# Patient Record
Sex: Female | Born: 1970 | Race: White | Hispanic: No | Marital: Married | Smoking: Former smoker
Health system: Southern US, Community
[De-identification: ages and names within clinical notes are randomized; demographics above are authoritative.]

## PROBLEM LIST (undated history)

## (undated) DIAGNOSIS — R109 Unspecified abdominal pain: Secondary | ICD-10-CM

## (undated) DIAGNOSIS — E785 Hyperlipidemia, unspecified: Secondary | ICD-10-CM

## (undated) DIAGNOSIS — R110 Nausea: Secondary | ICD-10-CM

## (undated) DIAGNOSIS — Z8742 Personal history of other diseases of the female genital tract: Secondary | ICD-10-CM

## (undated) DIAGNOSIS — N329 Bladder disorder, unspecified: Secondary | ICD-10-CM

## (undated) DIAGNOSIS — R011 Cardiac murmur, unspecified: Secondary | ICD-10-CM

## (undated) DIAGNOSIS — F17200 Nicotine dependence, unspecified, uncomplicated: Secondary | ICD-10-CM

## (undated) DIAGNOSIS — Z8249 Family history of ischemic heart disease and other diseases of the circulatory system: Secondary | ICD-10-CM

## (undated) DIAGNOSIS — F419 Anxiety disorder, unspecified: Secondary | ICD-10-CM

## (undated) DIAGNOSIS — F319 Bipolar disorder, unspecified: Secondary | ICD-10-CM

## (undated) DIAGNOSIS — Z9889 Other specified postprocedural states: Secondary | ICD-10-CM

## (undated) HISTORY — DX: Nausea: R11.0

## (undated) HISTORY — PX: LEEP: SHX91

## (undated) HISTORY — DX: Family history of ischemic heart disease and other diseases of the circulatory system: Z82.49

## (undated) HISTORY — DX: Personal history of other diseases of the female genital tract: Z87.42

## (undated) HISTORY — DX: Other specified postprocedural states: Z87.42

## (undated) HISTORY — PX: ELBOW SURGERY: SHX618

## (undated) HISTORY — DX: Nicotine dependence, unspecified, uncomplicated: F17.200

## (undated) HISTORY — PX: LAPAROSCOPIC OVARIAN CYSTECTOMY: SUR786

## (undated) HISTORY — DX: Bipolar disorder, unspecified: F31.9

## (undated) HISTORY — DX: Unspecified abdominal pain: R10.9

## (undated) HISTORY — PX: TONSILLECTOMY: SUR1361

## (undated) HISTORY — PX: RHINOPLASTY: SUR1284

## (undated) HISTORY — DX: Hyperlipidemia, unspecified: E78.5

## (undated) HISTORY — DX: Other specified postprocedural states: Z98.890

---

## 2009-04-02 ENCOUNTER — Encounter: Admission: RE | Admit: 2009-04-02 | Discharge: 2009-04-02 | Payer: Self-pay | Admitting: Orthopaedic Surgery

## 2010-07-15 ENCOUNTER — Ambulatory Visit: Payer: Self-pay | Admitting: Family Medicine

## 2010-11-02 ENCOUNTER — Ambulatory Visit: Payer: Self-pay | Admitting: Family Medicine

## 2011-02-19 ENCOUNTER — Encounter (INDEPENDENT_AMBULATORY_CARE_PROVIDER_SITE_OTHER): Payer: 59

## 2011-02-19 DIAGNOSIS — Z79899 Other long term (current) drug therapy: Secondary | ICD-10-CM

## 2011-03-16 ENCOUNTER — Encounter (INDEPENDENT_AMBULATORY_CARE_PROVIDER_SITE_OTHER): Payer: 59 | Admitting: Family Medicine

## 2011-03-16 ENCOUNTER — Other Ambulatory Visit: Payer: Self-pay | Admitting: Family Medicine

## 2011-03-16 ENCOUNTER — Other Ambulatory Visit (HOSPITAL_COMMUNITY)
Admission: RE | Admit: 2011-03-16 | Discharge: 2011-03-16 | Disposition: A | Discharge status: Home / Self Care | Payer: 59 | Source: Ambulatory Visit | Attending: Family Medicine | Admitting: Family Medicine

## 2011-03-16 DIAGNOSIS — Z8249 Family history of ischemic heart disease and other diseases of the circulatory system: Secondary | ICD-10-CM

## 2011-03-16 DIAGNOSIS — Z0000 Encounter for general adult medical examination without abnormal findings: Secondary | ICD-10-CM

## 2011-03-16 DIAGNOSIS — F319 Bipolar disorder, unspecified: Secondary | ICD-10-CM

## 2011-03-16 DIAGNOSIS — Z124 Encounter for screening for malignant neoplasm of cervix: Secondary | ICD-10-CM | POA: Insufficient documentation

## 2011-04-19 ENCOUNTER — Telehealth: Payer: Self-pay | Admitting: Family Medicine

## 2011-04-19 NOTE — Telephone Encounter (Signed)
I faxed the authorization per Dr.Lalonde

## 2011-04-19 NOTE — Telephone Encounter (Signed)
Renew diazepam

## 2011-05-27 ENCOUNTER — Ambulatory Visit (INDEPENDENT_AMBULATORY_CARE_PROVIDER_SITE_OTHER): Payer: 59 | Admitting: Family Medicine

## 2011-05-27 ENCOUNTER — Encounter: Payer: Self-pay | Admitting: Family Medicine

## 2011-05-27 VITALS — BP 112/68 | HR 76 | Ht 64.0 in | Wt 173.0 lb

## 2011-05-27 DIAGNOSIS — N939 Abnormal uterine and vaginal bleeding, unspecified: Secondary | ICD-10-CM

## 2011-05-27 DIAGNOSIS — N898 Other specified noninflammatory disorders of vagina: Secondary | ICD-10-CM

## 2011-05-27 NOTE — Progress Notes (Signed)
Patient hasn't had a period in 6 years due to having Mirena IUD.  Started having some spotting and clotting over the last month.  Denies any vaginal discharge.  Turned into a full-blown period this week, requiring the use of a tampon.  She put one in, and forgot about it.  Thinks she has had a tampon in since yesterday morning.  Has some lower abdominal tenderness (more related to having a period), has a headache (but awake since 4 am).  No foul smelling vaginal discharge.  Believes it is possible that the tampon may have fallen out into the toilet, but she wanted to have it checked to be sure.  She also is requesting to have the IUD strings checked.  Past Medical History  Diagnosis Date  . Bipolar disorder   . Tobacco use disorder    Current Outpatient Prescriptions on File Prior to Visit  Medication Sig Dispense Refill  . levonorgestrel (MIRENA) 20 MCG/24HR IUD 1 each by Intrauterine route once.         Allergies  Allergen Reactions  . Nitrous Oxide Other (See Comments)    Passes out   ROS: Denies fevers, vaginal discharge, odor or itch.  +irregular vaginal bleeding with Mirena.  No GI complaints, URI complaints, or other concerns  PHYSICAL EXAM: BP 112/68  Pulse 76  Ht 5\' 4"  (1.626 m)  Wt 173 lb (78.472 kg)  BMI 29.70 kg/m2 Well developed, pleasant female in no distress Abdomen: soft, minimal suprapubic tenderness. No rebound tenderness, guarding or masses External genitalia: normal without lesions Bimanual exam--no cervical motion tenderness.  Uterus and adnexa normal without masses or significant tenderness.  No tampon was able to be appreciated on bimanual exam.  Speculum exam was then performed to also look for retained tampon.  None was found.  IUD strings visualized at cervical os  ASSESSMENT/PLAN: 1. Vaginal bleeding    Suspected retained tampon, but none was present.  Patient reassured   F/u if develops foul smelling vaginal discharge or other concerns

## 2011-08-16 ENCOUNTER — Telehealth: Payer: Self-pay | Admitting: Family Medicine

## 2011-08-16 MED ORDER — DIAZEPAM 2 MG PO TABS: 2.0000 mg | ORAL_TABLET | ORAL | Status: DC | PRN

## 2011-08-16 NOTE — Telephone Encounter (Signed)
Diazepam renewed.

## 2011-08-19 ENCOUNTER — Other Ambulatory Visit: Payer: Self-pay

## 2011-08-19 MED ORDER — DIAZEPAM 2 MG PO TABS: 2.0000 mg | ORAL_TABLET | ORAL | Status: DC | PRN

## 2011-08-19 NOTE — Telephone Encounter (Signed)
CALLED DIAZEPAM 2 MG # 60 WITH 0 REFILL CALLED AND LEFT MESSAGE FOR PT CALLED IN TO [facility]

## 2011-10-07 ENCOUNTER — Encounter: Payer: Self-pay | Admitting: Family Medicine

## 2011-10-07 ENCOUNTER — Ambulatory Visit (INDEPENDENT_AMBULATORY_CARE_PROVIDER_SITE_OTHER): Payer: 59 | Admitting: Family Medicine

## 2011-10-07 DIAGNOSIS — F17200 Nicotine dependence, unspecified, uncomplicated: Secondary | ICD-10-CM

## 2011-10-07 DIAGNOSIS — E785 Hyperlipidemia, unspecified: Secondary | ICD-10-CM

## 2011-10-07 DIAGNOSIS — R079 Chest pain, unspecified: Secondary | ICD-10-CM

## 2011-10-07 DIAGNOSIS — Z8249 Family history of ischemic heart disease and other diseases of the circulatory system: Secondary | ICD-10-CM

## 2011-10-07 MED ORDER — ROSUVASTATIN CALCIUM 10 MG PO TABS: 10.0000 mg | ORAL_TABLET | Freq: Every day | ORAL | Status: DC

## 2011-10-07 NOTE — Progress Notes (Signed)
  Subjective:    Patient ID: Stacy Mckinney, female    DOB: 08-Nov-1971, 40 y.o.   MRN: [id number]  HPI She is here to discuss chest pain as well as smoking cessation. She has a two-month history of chest pain that tends to bother her more with physical activities. She has no shortness of breath, diaphoresis or weakness. She does do Zumba classes and has no difficulty with pain during that. Her father had an MI at age 3. She has a history of hyperlipidemia and was placed on Lipitor which cause leg cramps and she stopped. Presently she is on no medicine.   Review of Systems     Objective:   Physical Exam Alert and in no distress. She does have some tenderness palpation over anterior chest. Cardiac exam shows regular rhythm without murmurs or gallops.       Assessment & Plan:   1. Chest pain   2. Family history of heart disease in female family member before age 13   3. Hyperlipidemia LDL goal < 100   4. Current smoker    I had a long discussion with her concerning smoking cessation. Recommend she call the 800 number. I did research Chantix and her limited 2. They do not seem to be any major interactions however I did discuss with her possible side effects from Chantix. We also discussed using abdominal versus nicotine patches. She will let me know which way she wants to go. I then recommend she followup with me in roughly one month We discussed her chest pain and the fact that she will eventually be referred to cardiologist. Did not think that the pain is heart related since she is having tenderness to palpation and she can do vigorous physical activities with no pain. She will also let he know how the Crestor is working.

## 2011-10-07 NOTE — Patient Instructions (Addendum)
Call 800 quit now and start talking to them. Try the Crestor and if you have no problems with it go ahead and get it filled. Started on it and then we will check your numbers in 2 months I will call you back after I have a chance to research Chantix and the Lamictal

## 2011-10-08 ENCOUNTER — Encounter: Payer: Self-pay | Admitting: Family Medicine

## 2011-11-01 ENCOUNTER — Telehealth: Payer: Self-pay | Admitting: Family Medicine

## 2011-11-01 ENCOUNTER — Other Ambulatory Visit: Payer: Self-pay

## 2011-11-01 MED ORDER — LAMOTRIGINE 200 MG PO TABS: 200.0000 mg | ORAL_TABLET | Freq: Every day | ORAL | Status: DC

## 2011-11-01 NOTE — Telephone Encounter (Signed)
She needs a followup visit concerning her Lamictal

## 2011-11-01 NOTE — Telephone Encounter (Signed)
Limit the renewed. Patient will need a followup visit.

## 2011-11-04 ENCOUNTER — Other Ambulatory Visit: Payer: Self-pay

## 2011-11-04 MED ORDER — LAMOTRIGINE 200 MG PO TABS: 200.0000 mg | ORAL_TABLET | Freq: Every day | ORAL | Status: DC

## 2011-11-04 NOTE — Telephone Encounter (Signed)
Pt called and wanted med sent to [facility] only

## 2012-02-14 ENCOUNTER — Other Ambulatory Visit: Payer: Self-pay | Admitting: Family Medicine

## 2012-02-15 NOTE — Telephone Encounter (Signed)
Is this ok pt called yesterday and asked to remember name brand only

## 2012-04-25 ENCOUNTER — Telehealth: Payer: Self-pay | Admitting: Family Medicine

## 2012-04-25 MED ORDER — DIAZEPAM 2 MG PO TABS: 2.0000 mg | ORAL_TABLET | ORAL | Status: DC | PRN

## 2012-04-25 NOTE — Telephone Encounter (Signed)
Renew the diazepam.

## 2012-04-25 NOTE — Telephone Encounter (Signed)
Called valuim in per [employer]

## 2012-06-29 ENCOUNTER — Telehealth: Payer: Self-pay | Admitting: Internal Medicine

## 2012-06-30 MED ORDER — VALACYCLOVIR HCL 500 MG PO TABS: 2000.0000 mg | ORAL_TABLET | Freq: Once | ORAL | Status: DC

## 2012-06-30 NOTE — Telephone Encounter (Signed)
Valtrex renewed.  

## 2012-07-26 ENCOUNTER — Other Ambulatory Visit: Payer: Self-pay | Admitting: Family Medicine

## 2012-07-26 NOTE — Telephone Encounter (Signed)
Is this ok?

## 2012-07-26 NOTE — Telephone Encounter (Signed)
She needs an appointment but don't let her run out

## 2012-07-28 ENCOUNTER — Telehealth: Payer: Self-pay | Admitting: Family Medicine

## 2012-07-28 NOTE — Telephone Encounter (Signed)
DONE

## 2012-08-04 ENCOUNTER — Encounter: Payer: Self-pay | Admitting: Family Medicine

## 2012-08-04 ENCOUNTER — Ambulatory Visit (INDEPENDENT_AMBULATORY_CARE_PROVIDER_SITE_OTHER): Payer: 59 | Admitting: Family Medicine

## 2012-08-04 VITALS — BP 120/80 | HR 91 | Ht 64.0 in | Wt 188.0 lb

## 2012-08-04 DIAGNOSIS — Z0000 Encounter for general adult medical examination without abnormal findings: Secondary | ICD-10-CM

## 2012-08-04 DIAGNOSIS — F39 Unspecified mood [affective] disorder: Secondary | ICD-10-CM

## 2012-08-04 DIAGNOSIS — B009 Herpesviral infection, unspecified: Secondary | ICD-10-CM

## 2012-08-04 DIAGNOSIS — E785 Hyperlipidemia, unspecified: Secondary | ICD-10-CM

## 2012-08-04 DIAGNOSIS — B001 Herpesviral vesicular dermatitis: Secondary | ICD-10-CM

## 2012-08-04 DIAGNOSIS — Z8249 Family history of ischemic heart disease and other diseases of the circulatory system: Secondary | ICD-10-CM

## 2012-08-04 DIAGNOSIS — F17200 Nicotine dependence, unspecified, uncomplicated: Secondary | ICD-10-CM

## 2012-08-04 LAB — POCT URINALYSIS DIPSTICK
Bilirubin, UA: NEGATIVE
Glucose, UA: NEGATIVE
Ketones, UA: NEGATIVE
Leukocytes, UA: NEGATIVE

## 2012-08-04 LAB — CBC WITH DIFFERENTIAL/PLATELET
Basophils Absolute: 0 10*3/uL (ref 0.0–0.1)
HCT: 41.4 % (ref 36.0–46.0)
Hemoglobin: 14.1 g/dL (ref 12.0–15.0)
Lymphocytes Relative: 31 % (ref 12–46)
Monocytes Absolute: 0.6 10*3/uL (ref 0.1–1.0)
Monocytes Relative: 7 % (ref 3–12)
Neutro Abs: 5.6 10*3/uL (ref 1.7–7.7)
RBC: 4.7 MIL/uL (ref 3.87–5.11)
RDW: 13.2 % (ref 11.5–15.5)
WBC: 9.3 10*3/uL (ref 4.0–10.5)

## 2012-08-04 LAB — COMPREHENSIVE METABOLIC PANEL
AST: 16 U/L (ref 0–37)
Albumin: 4.3 g/dL (ref 3.5–5.2)
BUN: 8 mg/dL (ref 6–23)
Calcium: 9.6 mg/dL (ref 8.4–10.5)
Chloride: 103 mEq/L (ref 96–112)
Potassium: 3.7 mEq/L (ref 3.5–5.3)

## 2012-08-04 LAB — LIPID PANEL: HDL: 39 mg/dL — ABNORMAL LOW (ref >39)

## 2012-08-04 MED ORDER — VALACYCLOVIR HCL 500 MG PO TABS: 2000.0000 mg | ORAL_TABLET | Freq: Once | ORAL | Status: DC

## 2012-08-04 MED ORDER — LAMICTAL 200 MG PO TABS: 200.0000 mg | ORAL_TABLET | Freq: Every day | ORAL | Status: DC

## 2012-08-04 MED ORDER — SIMVASTATIN 20 MG PO TABS: 20.0000 mg | ORAL_TABLET | Freq: Every day | ORAL | Status: DC

## 2012-08-04 NOTE — Progress Notes (Signed)
Subjective:    Patient ID: Stacy Mckinney, female    DOB: 04/16/1971, 41 y.o.   MRN: [id number]  HPI He is here for complete examination. She continues on the tone is doing quite well on this. Her mood disorder was diagnosed several years ago. She continues to use Valium mainly for anxiety. She stopped taking Crestor because of difficulty with flulike symptoms and aching. She also had trouble with Lipitor causing the same problems. She continues to smoke and is making an attempt to quit. She is limiting the areas that she can smoking. 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 She does have an IUD which was put in 3 or 4 years ago. Social and family history was reviewed. She travels a lot with her job.   Review of Systems  Constitutional: Negative.   HENT: Negative.   Eyes: Negative.   Respiratory: Negative.   Cardiovascular: Negative.   Gastrointestinal: Negative.   Genitourinary: Negative.   Musculoskeletal: Negative.   Skin: Negative.   Neurological: Negative.   Hematological: Negative.   Psychiatric/Behavioral: Negative.        Objective:   Physical Exam BP 120/80  Pulse 91  Ht 5\' 4"  (1.626 m)  Wt 188 lb (85.276 kg)  BMI 32.27 kg/m2  General Appearance:    Alert, cooperative, no distress, appears stated age  Head:    Normocephalic, without obvious abnormality, atraumatic  Eyes:    PERRL, conjunctiva/corneas clear, EOM's intact, fundi    benign  Ears:    Normal TM's and external ear canals  Nose:   Nares normal, mucosa normal, no drainage or sinus   tenderness  Throat:   Lips, mucosa, and tongue normal; teeth and gums normal  Neck:   Supple, no lymphadenopathy;  thyroid:  no   enlargement/tenderness/nodules; no carotid   bruit or JVD  Back:    Spine nontender, no curvature, ROM normal, no CVA     tenderness  Lungs:     Clear to auscultation bilaterally without wheezes, rales or     ronchi; respirations unlabored  Chest  Wall:    No tenderness or deformity   Heart:    Regular rate and rhythm, S1 and S2 normal, no murmur, rub   or gallop  Breast Exam:    Deferred to GYN  Abdomen:     Soft, non-tender, nondistended, normoactive bowel sounds,    no masses, no hepatosplenomegaly  Genitalia:    Deferred to GYN     Extremities:   No clubbing, cyanosis or edema  Pulses:   2+ and symmetric all extremities  Skin:   Skin color, texture, turgor normal, no rashes or lesions  Lymph nodes:   Cervical, supraclavicular, and axillary nodes normal  Neurologic:   CNII-XII intact, normal strength, sensation and gait; reflexes 2+ and symmetric throughout          Psych:   Normal mood, affect, hygiene and grooming.           Assessment & Plan:   1. Routine general medical examination at a health care facility  POCT Urinalysis Dipstick, CBC with Differential, Comprehensive metabolic panel, Lipid panel  2. Mood disorder  LAMICTAL 200 MG tablet  3. Hyperlipidemia LDL goal < 100  simvastatin (ZOCOR) 20 MG tablet, Lipid panel  4. Family history of heart disease in female family member before age 44  simvastatin (ZOCOR) 20 MG tablet, Lipid panel  5. Smoker    6. Herpes labialis  valACYclovir (VALTREX) 500 MG tablet   she is to let me know how she does on the simvastatin otherwise I will see her in several months. Continue on her other medications. Routine blood work ordered.

## 2012-08-07 ENCOUNTER — Other Ambulatory Visit: Payer: Self-pay

## 2012-08-07 MED ORDER — SIMVASTATIN 40 MG PO TABS: 40.0000 mg | ORAL_TABLET | Freq: Every day | ORAL | Status: DC

## 2012-08-07 NOTE — Telephone Encounter (Signed)
Sent in 40 mg simvastain

## 2012-10-10 ENCOUNTER — Encounter: Payer: Self-pay | Admitting: Internal Medicine

## 2012-10-13 ENCOUNTER — Other Ambulatory Visit: Payer: Self-pay

## 2012-10-13 ENCOUNTER — Ambulatory Visit (INDEPENDENT_AMBULATORY_CARE_PROVIDER_SITE_OTHER): Payer: 59 | Admitting: Family Medicine

## 2012-10-13 ENCOUNTER — Encounter: Payer: Self-pay | Admitting: Family Medicine

## 2012-10-13 VITALS — BP 116/76 | Wt 191.0 lb

## 2012-10-13 DIAGNOSIS — E785 Hyperlipidemia, unspecified: Secondary | ICD-10-CM

## 2012-10-13 DIAGNOSIS — N390 Urinary tract infection, site not specified: Secondary | ICD-10-CM

## 2012-10-13 DIAGNOSIS — F39 Unspecified mood [affective] disorder: Secondary | ICD-10-CM

## 2012-10-13 DIAGNOSIS — Z79899 Other long term (current) drug therapy: Secondary | ICD-10-CM

## 2012-10-13 LAB — POCT URINALYSIS DIPSTICK
Blood, UA: 50
Protein, UA: NEGATIVE
Spec Grav, UA: 1.01
Urobilinogen, UA: NEGATIVE
pH, UA: 6

## 2012-10-13 MED ORDER — SULFAMETHOXAZOLE-TMP DS 800-160 MG PO TABS: 1.0000 | ORAL_TABLET | Freq: Two times a day (BID) | ORAL | Status: DC

## 2012-10-13 MED ORDER — ALPRAZOLAM 0.25 MG PO TABS: ORAL_TABLET | ORAL | Status: DC

## 2012-10-13 NOTE — Progress Notes (Signed)
Pt informed to drop of urine sample in 2 weeks pt verbalized understanding

## 2012-10-13 NOTE — Telephone Encounter (Signed)
Called in xanax per jcl 

## 2012-10-13 NOTE — Progress Notes (Signed)
  Subjective:    Patient ID: Stacy Mckinney, female    DOB: Sep 07, 1971, 41 y.o.   MRN: [id number]  HPI She is here for a recheck. She is now taking Zocor and having no difficulty with this. She also is taking fish oil. She does have an underlying mood disorder and has been stable on her present medications for several years. She is not interested in switching. She has been on diet for a long time and seems to be tolerating this well. She uses it once or twice per week. She also is having frequency urgency and feeling of incomplete emptying for the last several days.   Review of Systems     Objective:   Physical Exam Alert and in no distress with appropriate affect. Urine dipstick was positive.       Assessment & Plan:   1. Mood disorder  ALPRAZolam (XANAX) 0.25 MG tablet  2. Hyperlipidemia LDL goal < 100  Lipid panel  3. UTI (lower urinary tract infection)  sulfamethoxazole-trimethoprim (BACTRIM DS) 800-160 MG per tablet, POCT Urinalysis Dipstick  4. Encounter for long-term (current) use of other medications     discussed options concerning the mood disorder. I will try her on Xanax explaining a river have her on this in Valium because of less sedation and shorter action. She is to call me in 3 days if the Bactrim does not clear up her UTI symptoms.

## 2012-10-14 LAB — LIPID PANEL
Cholesterol: 156 mg/dL (ref 0–200)
HDL: 34 mg/dL — ABNORMAL LOW (ref >39)
Total CHOL/HDL Ratio: 4.6 Ratio
Triglycerides: 206 mg/dL — ABNORMAL HIGH (ref <150)
VLDL: 41 mg/dL — ABNORMAL HIGH (ref 0–40)

## 2012-10-16 NOTE — Progress Notes (Signed)
Quick Note:  Mailed pt copy of labs and diet info ______ 

## 2012-10-23 ENCOUNTER — Encounter (HOSPITAL_COMMUNITY): Admission: EM | Disposition: A | Discharge status: Home / Self Care | Payer: Self-pay | Source: Home / Self Care

## 2012-10-23 ENCOUNTER — Ambulatory Visit
Admission: RE | Admit: 2012-10-23 | Discharge: 2012-10-23 | Disposition: A | Discharge status: Home / Self Care | Payer: 59 | Source: Ambulatory Visit | Attending: Medical | Admitting: Medical

## 2012-10-23 ENCOUNTER — Telehealth: Payer: Self-pay | Admitting: Family Medicine

## 2012-10-23 ENCOUNTER — Encounter (HOSPITAL_COMMUNITY): Payer: Self-pay | Admitting: Anesthesiology

## 2012-10-23 ENCOUNTER — Observation Stay (HOSPITAL_COMMUNITY)
Admission: EM | Admit: 2012-10-23 | Discharge: 2012-10-24 | Disposition: A | Discharge status: Home / Self Care | Payer: 59 | Attending: General Surgery | Admitting: General Surgery

## 2012-10-23 ENCOUNTER — Encounter: Payer: Self-pay | Admitting: Medical

## 2012-10-23 ENCOUNTER — Encounter (HOSPITAL_COMMUNITY): Payer: Self-pay | Admitting: *Deleted

## 2012-10-23 ENCOUNTER — Ambulatory Visit (INDEPENDENT_AMBULATORY_CARE_PROVIDER_SITE_OTHER): Payer: 59 | Admitting: Medical

## 2012-10-23 ENCOUNTER — Emergency Department (HOSPITAL_COMMUNITY): Payer: 59 | Admitting: Anesthesiology

## 2012-10-23 VITALS — BP 98/60 | HR 72 | Temp 98.2°F | Resp 16 | Wt 188.0 lb

## 2012-10-23 DIAGNOSIS — M549 Dorsalgia, unspecified: Secondary | ICD-10-CM

## 2012-10-23 DIAGNOSIS — R630 Anorexia: Secondary | ICD-10-CM

## 2012-10-23 DIAGNOSIS — R1031 Right lower quadrant pain: Secondary | ICD-10-CM

## 2012-10-23 DIAGNOSIS — K3580 Unspecified acute appendicitis: Secondary | ICD-10-CM

## 2012-10-23 DIAGNOSIS — F17200 Nicotine dependence, unspecified, uncomplicated: Secondary | ICD-10-CM

## 2012-10-23 DIAGNOSIS — E785 Hyperlipidemia, unspecified: Secondary | ICD-10-CM

## 2012-10-23 DIAGNOSIS — E7800 Pure hypercholesterolemia, unspecified: Secondary | ICD-10-CM | POA: Insufficient documentation

## 2012-10-23 DIAGNOSIS — F39 Unspecified mood [affective] disorder: Secondary | ICD-10-CM

## 2012-10-23 DIAGNOSIS — F319 Bipolar disorder, unspecified: Secondary | ICD-10-CM | POA: Insufficient documentation

## 2012-10-23 HISTORY — DX: Anxiety disorder, unspecified: F41.9

## 2012-10-23 HISTORY — PX: LAPAROSCOPIC APPENDECTOMY: SHX408

## 2012-10-23 LAB — BASIC METABOLIC PANEL
BUN: 11 mg/dL (ref 6–23)
Calcium: 9.7 mg/dL (ref 8.4–10.5)
GFR calc Af Amer: >90 mL/min (ref >90)
GFR calc non Af Amer: >90 mL/min (ref >90)
Potassium: 3.6 mEq/L (ref 3.5–5.1)
Sodium: 136 mEq/L (ref 135–145)

## 2012-10-23 LAB — POCT URINALYSIS DIPSTICK
Ketones, UA: NEGATIVE
Protein, UA: NEGATIVE
Spec Grav, UA: 1.015
pH, UA: 6

## 2012-10-23 LAB — CBC WITH DIFFERENTIAL/PLATELET
HCT: 41.4 % (ref 36.0–46.0)
Hemoglobin: 13.6 g/dL (ref 12.0–15.0)
Lymphocytes Relative: 16 % (ref 12–46)
MCHC: 32.9 g/dL (ref 30.0–36.0)
MCV: 88.4 fL (ref 78.0–100.0)
Monocytes Absolute: 0.3 10*3/uL (ref 0.1–1.0)
Monocytes Relative: 2 % — ABNORMAL LOW (ref 3–12)
RDW: 12.4 % (ref 11.5–15.5)

## 2012-10-23 SURGERY — APPENDECTOMY, LAPAROSCOPIC
Anesthesia: General | Site: Abdomen | Wound class: Clean Contaminated

## 2012-10-23 MED ORDER — PROMETHAZINE HCL 25 MG/ML IJ SOLN: 6.2500 mg | INTRAMUSCULAR | Status: DC | PRN

## 2012-10-23 MED ORDER — ONDANSETRON HCL 4 MG PO TABS: 4.0000 mg | ORAL_TABLET | Freq: Four times a day (QID) | ORAL | Status: DC | PRN

## 2012-10-23 MED ORDER — MORPHINE SULFATE 2 MG/ML IJ SOLN: 1.0000 mg | INTRAMUSCULAR | Status: DC | PRN

## 2012-10-23 MED ORDER — DEXAMETHASONE SODIUM PHOSPHATE 10 MG/ML IJ SOLN
INTRAMUSCULAR | Status: DC | PRN
  Administered 2012-10-23: 8 mg via INTRAVENOUS

## 2012-10-23 MED ORDER — GLYCOPYRROLATE 0.2 MG/ML IJ SOLN
INTRAMUSCULAR | Status: DC | PRN
  Administered 2012-10-23: .6 mg via INTRAVENOUS

## 2012-10-23 MED ORDER — LACTATED RINGERS IR SOLN
Status: DC | PRN
  Administered 2012-10-23: 1000 mL

## 2012-10-23 MED ORDER — DIAZEPAM 2 MG PO TABS: 2.0000 mg | ORAL_TABLET | Freq: Once | ORAL | Status: DC

## 2012-10-23 MED ORDER — PROPOFOL 10 MG/ML IV BOLUS
INTRAVENOUS | Status: DC | PRN
  Administered 2012-10-23: 180 mg via INTRAVENOUS
  Administered 2012-10-23: 20 mg via INTRAVENOUS

## 2012-10-23 MED ORDER — IOHEXOL 300 MG/ML  SOLN
100.0000 mL | Freq: Once | INTRAMUSCULAR | Status: AC | PRN
  Administered 2012-10-23: 100 mL via INTRAVENOUS

## 2012-10-23 MED ORDER — SUCCINYLCHOLINE CHLORIDE 20 MG/ML IJ SOLN
INTRAMUSCULAR | Status: DC | PRN
  Administered 2012-10-23: 60 mg via INTRAVENOUS

## 2012-10-23 MED ORDER — FENTANYL CITRATE 0.05 MG/ML IJ SOLN
INTRAMUSCULAR | Status: DC | PRN
  Administered 2012-10-23 (×2): 50 ug via INTRAVENOUS
  Administered 2012-10-23: 100 ug via INTRAVENOUS

## 2012-10-23 MED ORDER — LACTATED RINGERS IV SOLN
INTRAVENOUS | Status: DC | PRN
  Administered 2012-10-23 (×2): via INTRAVENOUS

## 2012-10-23 MED ORDER — KETOROLAC TROMETHAMINE 30 MG/ML IJ SOLN
INTRAMUSCULAR | Status: AC
  Filled 2012-10-23: qty 1

## 2012-10-23 MED ORDER — 0.9 % SODIUM CHLORIDE (POUR BTL) OPTIME
TOPICAL | Status: DC | PRN
  Administered 2012-10-23: 1000 mL

## 2012-10-23 MED ORDER — ACETAMINOPHEN 10 MG/ML IV SOLN
INTRAVENOUS | Status: AC
  Filled 2012-10-23: qty 100

## 2012-10-23 MED ORDER — BUPIVACAINE-EPINEPHRINE 0.25% -1:200000 IJ SOLN
INTRAMUSCULAR | Status: AC
  Filled 2012-10-23: qty 1

## 2012-10-23 MED ORDER — HYDROCODONE-ACETAMINOPHEN 5-325 MG PO TABS
1.0000 | ORAL_TABLET | ORAL | Status: DC | PRN
  Administered 2012-10-24: 2 via ORAL
  Administered 2012-10-24 (×2): 1 via ORAL
  Filled 2012-10-23: qty 2
  Filled 2012-10-23 (×2): qty 1

## 2012-10-23 MED ORDER — DEXTROSE IN LACTATED RINGERS 5 % IV SOLN
INTRAVENOUS | Status: DC
  Administered 2012-10-23: 20:00:00 via INTRAVENOUS

## 2012-10-23 MED ORDER — IBUPROFEN 600 MG PO TABS
600.0000 mg | ORAL_TABLET | Freq: Four times a day (QID) | ORAL | Status: DC | PRN
  Filled 2012-10-23: qty 1

## 2012-10-23 MED ORDER — NEOSTIGMINE METHYLSULFATE 1 MG/ML IJ SOLN
INTRAMUSCULAR | Status: DC | PRN
  Administered 2012-10-23: 4 mg via INTRAVENOUS

## 2012-10-23 MED ORDER — LIDOCAINE HCL (CARDIAC) 20 MG/ML IV SOLN
INTRAVENOUS | Status: DC | PRN
  Administered 2012-10-23: 100 mg via INTRAVENOUS

## 2012-10-23 MED ORDER — IOHEXOL 300 MG/ML  SOLN
20.0000 mL | Freq: Once | INTRAMUSCULAR | Status: AC | PRN
  Administered 2012-10-23: 30 mL via ORAL

## 2012-10-23 MED ORDER — SODIUM CHLORIDE 0.9 % IV SOLN
3.0000 g | Freq: Once | INTRAVENOUS | Status: AC
  Administered 2012-10-23: 3 g via INTRAVENOUS
  Filled 2012-10-23: qty 3

## 2012-10-23 MED ORDER — SODIUM CHLORIDE 0.9 % IV SOLN
3.0000 g | Freq: Four times a day (QID) | INTRAVENOUS | Status: DC
  Administered 2012-10-24 (×2): 3 g via INTRAVENOUS
  Filled 2012-10-23 (×4): qty 3

## 2012-10-23 MED ORDER — ONDANSETRON HCL 4 MG/2ML IJ SOLN
INTRAMUSCULAR | Status: DC | PRN
  Administered 2012-10-23: 4 mg via INTRAVENOUS

## 2012-10-23 MED ORDER — LAMOTRIGINE 200 MG PO TABS
200.0000 mg | ORAL_TABLET | Freq: Every day | ORAL | Status: DC
  Administered 2012-10-24: 200 mg via ORAL
  Filled 2012-10-23 (×2): qty 1

## 2012-10-23 MED ORDER — ACETAMINOPHEN 10 MG/ML IV SOLN
INTRAVENOUS | Status: DC | PRN
  Administered 2012-10-23: 1000 mg via INTRAVENOUS

## 2012-10-23 MED ORDER — DIAZEPAM 2 MG PO TABS: 2.0000 mg | ORAL_TABLET | Freq: Four times a day (QID) | ORAL | Status: DC | PRN

## 2012-10-23 MED ORDER — KCL IN DEXTROSE-NACL 30-5-0.45 MEQ/L-%-% IV SOLN
INTRAVENOUS | Status: DC
  Administered 2012-10-24: via INTRAVENOUS
  Filled 2012-10-23 (×2): qty 1000

## 2012-10-23 MED ORDER — FENTANYL CITRATE 0.05 MG/ML IJ SOLN
25.0000 ug | INTRAMUSCULAR | Status: DC | PRN
  Administered 2012-10-23 (×2): 50 ug via INTRAVENOUS

## 2012-10-23 MED ORDER — ONDANSETRON HCL 4 MG/2ML IJ SOLN: 4.0000 mg | Freq: Four times a day (QID) | INTRAMUSCULAR | Status: DC

## 2012-10-23 MED ORDER — KETOROLAC TROMETHAMINE 30 MG/ML IJ SOLN
15.0000 mg | Freq: Once | INTRAMUSCULAR | Status: AC | PRN
  Administered 2012-10-23: 30 mg via INTRAVENOUS

## 2012-10-23 MED ORDER — CISATRACURIUM BESYLATE (PF) 10 MG/5ML IV SOLN
INTRAVENOUS | Status: DC | PRN
  Administered 2012-10-23: 4 mg via INTRAVENOUS
  Administered 2012-10-23: 2 mg via INTRAVENOUS

## 2012-10-23 MED ORDER — BUPIVACAINE-EPINEPHRINE 0.25% -1:200000 IJ SOLN
INTRAMUSCULAR | Status: DC | PRN
  Administered 2012-10-23: 20 mL

## 2012-10-23 MED ORDER — FENTANYL CITRATE 0.05 MG/ML IJ SOLN
INTRAMUSCULAR | Status: AC
  Filled 2012-10-23: qty 2

## 2012-10-23 MED ORDER — ONDANSETRON HCL 4 MG/2ML IJ SOLN
4.0000 mg | Freq: Four times a day (QID) | INTRAMUSCULAR | Status: DC | PRN
  Administered 2012-10-24: 4 mg via INTRAVENOUS
  Filled 2012-10-23: qty 2

## 2012-10-23 SURGICAL SUPPLY — 41 items
APPLIER CLIP ROT 10 11.4 M/L (STAPLE)
BENZOIN TINCTURE PRP APPL 2/3 (GAUZE/BANDAGES/DRESSINGS) ×2 IMPLANT
CABLE HIGH FREQUENCY MONO STRZ (ELECTRODE) ×2 IMPLANT
CANISTER SUCTION 2500CC (MISCELLANEOUS) ×2 IMPLANT
CLIP APPLIE ROT 10 11.4 M/L (STAPLE) IMPLANT
CLOTH BEACON ORANGE TIMEOUT ST (SAFETY) ×2 IMPLANT
CUTTER FLEX LINEAR 45M (STAPLE) ×2 IMPLANT
DECANTER SPIKE VIAL GLASS SM (MISCELLANEOUS) ×2 IMPLANT
DRAPE LAPAROSCOPIC ABDOMINAL (DRAPES) ×2 IMPLANT
ELECT REM PT RETURN 9FT ADLT (ELECTROSURGICAL) ×2
ELECTRODE REM PT RTRN 9FT ADLT (ELECTROSURGICAL) ×1 IMPLANT
ENDOLOOP SUT PDS II  0 18 (SUTURE)
ENDOLOOP SUT PDS II 0 18 (SUTURE) IMPLANT
GLOVE BIOGEL PI IND STRL 7.0 (GLOVE) ×2 IMPLANT
GLOVE BIOGEL PI INDICATOR 7.0 (GLOVE) ×2
GLOVE SURG ORTHO 8.0 STRL STRW (GLOVE) ×2 IMPLANT
GLOVE SURG SS PI 7.0 STRL IVOR (GLOVE) ×2 IMPLANT
GOWN STRL NON-REIN LRG LVL3 (GOWN DISPOSABLE) ×2 IMPLANT
GOWN STRL REIN XL XLG (GOWN DISPOSABLE) ×4 IMPLANT
HAND ACTIVATED (MISCELLANEOUS) ×2 IMPLANT
IV LACTATED RINGERS 1000ML (IV SOLUTION) ×2 IMPLANT
KIT BASIN OR (CUSTOM PROCEDURE TRAY) ×2 IMPLANT
NS IRRIG 1000ML POUR BTL (IV SOLUTION) ×2 IMPLANT
PENCIL BUTTON HOLSTER BLD 10FT (ELECTRODE) IMPLANT
POUCH SPECIMEN RETRIEVAL 10MM (ENDOMECHANICALS) ×2 IMPLANT
RELOAD 45 VASCULAR/THIN (ENDOMECHANICALS) IMPLANT
RELOAD STAPLE TA45 3.5 REG BLU (ENDOMECHANICALS) ×2 IMPLANT
SET IRRIG TUBING LAPAROSCOPIC (IRRIGATION / IRRIGATOR) IMPLANT
SOLUTION ANTI FOG 6CC (MISCELLANEOUS) ×2 IMPLANT
SPONGE GAUZE 4X4 12PLY (GAUZE/BANDAGES/DRESSINGS) ×2 IMPLANT
STRIP CLOSURE SKIN 1/2X4 (GAUZE/BANDAGES/DRESSINGS) ×2 IMPLANT
SUT MNCRL AB 4-0 PS2 18 (SUTURE) ×2 IMPLANT
TAPE CLOTH SURG 4X10 WHT LF (GAUZE/BANDAGES/DRESSINGS) ×2 IMPLANT
TOWEL OR 17X26 10 PK STRL BLUE (TOWEL DISPOSABLE) ×2 IMPLANT
TRAY FOLEY CATH 14FRSI W/METER (CATHETERS) ×2 IMPLANT
TRAY LAP CHOLE (CUSTOM PROCEDURE TRAY) ×2 IMPLANT
TROCAR BLADELESS OPT 5 75 (ENDOMECHANICALS) ×2 IMPLANT
TROCAR XCEL BLUNT TIP 100MML (ENDOMECHANICALS) ×2 IMPLANT
TROCAR XCEL NON-BLD 11X100MML (ENDOMECHANICALS) ×2 IMPLANT
TUBING INSUFFLATION 10FT LAP (TUBING) ×2 IMPLANT
WATER STERILE IRR 1500ML POUR (IV SOLUTION) IMPLANT

## 2012-10-23 NOTE — Transfer of Care (Signed)
Immediate Anesthesia Transfer of Care Note  Patient: Stacy Mckinney  Procedure(s) Performed: Procedure(s) (LRB) with comments: APPENDECTOMY LAPAROSCOPIC (N/A)  Patient Location: PACU  Anesthesia Type:General  Level of Consciousness: awake, alert  and oriented  Airway & Oxygen Therapy: Patient Spontanous Breathing and Patient connected to face mask oxygen  Post-op Assessment: Report given to PACU RN and Post -op Vital signs reviewed and stable  Post vital signs: Reviewed and stable  Complications: No apparent anesthesia complications

## 2012-10-23 NOTE — Telephone Encounter (Signed)
CT ABDOMEN/ PELVIS WITH CONTRAST PRIOR AUTH- [id number]. CLS

## 2012-10-23 NOTE — Anesthesia Preprocedure Evaluation (Signed)
Anesthesia Evaluation  Patient identified by MRN, date of birth, ID band Patient awake    Reviewed: Allergy & Precautions, H&P , NPO status , Patient's Chart, lab work & pertinent test results  Airway Mallampati: II TM Distance: >3 FB Neck ROM: Full    Dental No notable dental hx.    Pulmonary Current Smoker,  breath sounds clear to auscultation  Pulmonary exam normal       Cardiovascular negative cardio ROS  Rhythm:Regular Rate:Normal     Neuro/Psych Bipolar Disorder negative neurological ROS     GI/Hepatic negative GI ROS, Neg liver ROS,   Endo/Other  negative endocrine ROS  Renal/GU negative Renal ROS  negative genitourinary   Musculoskeletal negative musculoskeletal ROS (+)   Abdominal   Peds negative pediatric ROS (+)  Hematology negative hematology ROS (+)   Anesthesia Other Findings   Reproductive/Obstetrics negative OB ROS                           Anesthesia Physical Anesthesia Plan  ASA: II and emergent  Anesthesia Plan: General   Post-op Pain Management:    Induction: Intravenous and Rapid sequence  Airway Management Planned: Oral ETT  Additional Equipment:   Intra-op Plan:   Post-operative Plan: Extubation in OR  Informed Consent: I have reviewed the patients History and Physical, chart, labs and discussed the procedure including the risks, benefits and alternatives for the proposed anesthesia with the patient or authorized representative who has indicated his/her understanding and acceptance.   Dental advisory given  Plan Discussed with: CRNA and Surgeon  Anesthesia Plan Comments:         Anesthesia Quick Evaluation

## 2012-10-23 NOTE — Anesthesia Postprocedure Evaluation (Signed)
  Anesthesia Post-op Note  Patient: Stacy Mckinney  Procedure(s) Performed: Procedure(s) (LRB): APPENDECTOMY LAPAROSCOPIC (N/A)  Patient Location: PACU  Anesthesia Type: General  Level of Consciousness: awake and alert   Airway and Oxygen Therapy: Patient Spontanous Breathing  Post-op Pain: mild  Post-op Assessment: Post-op Vital signs reviewed, Patient's Cardiovascular Status Stable, Respiratory Function Stable, Patent Airway and No signs of Nausea or vomiting  Last Vitals:  Filed Vitals:   10/23/12 1844  BP: 132/76  Pulse: 85  Temp: 36.8 C  Resp: 18    Post-op Vital Signs: stable   Complications: No apparent anesthesia complications

## 2012-10-23 NOTE — Progress Notes (Addendum)
Subjective: Here for c/o abdominal pain.  Started last night with pain at umbilicus that has progressively worsened over to RLQ.  Initially felt urge to defecate, but had firm solid BM like normal.  Thought it was gas, took gas x, burped a few times, drank some 7up, no better no worse. Had UTI recently, not sure if it is a urinary tract infection again.  She also notes prior similar discomfort in past with ovarian cyst, but at that time had cysts bilat.  She reports abdominal bloating, some back pain on the right, no appetite since yesterday.  Denies nausea, diarrhea, but did vomit this morning gagging when brushing teeth.  Over the course of today has had increasing pain, now 7-8/10 pain, driving here made in uncomfortable, coughing and laughing and moving also makes it uncomfortable.  No other aggravating or relieving factors.  No urinary symptoms or Gyn symptoms otherwise.  No blood in stool or urine.  No fever.  Last meal was 12:30pm today at lunch.   Allergies  Allergen Reactions  . Nitrous Oxide Other (See Comments)    Passes out    Current Outpatient Prescriptions on File Prior to Visit  Medication Sig Dispense Refill  . ALPRAZolam (XANAX) 0.25 MG tablet One daily as needed for anxiety  30 tablet  0  . LAMICTAL 200 MG tablet Take 1 tablet (200 mg total) by mouth daily.  90 tablet  3  . levonorgestrel (MIRENA) 20 MCG/24HR IUD 1 each by Intrauterine route once.        . Omega-3 Fatty Acids (FISH OIL) 1000 MG CPDR Take 1 capsule by mouth daily.        . simvastatin (ZOCOR) 40 MG tablet Take 1 tablet (40 mg total) by mouth at bedtime.  90 tablet  1  . valACYclovir (VALTREX) 500 MG tablet Take 4 tablets (2,000 mg total) by mouth once.  12 tablet  1   No current facility-administered medications on file prior to visit.    Past Medical History  Diagnosis Date  . Bipolar disorder   . Tobacco use disorder   . FHx: cardiovascular disease   . Smoker   . H/O ovarian cystectomy     Past  Surgical History  Procedure Date  . Leep   . Laparoscopic ovarian cystectomy     Family History  Problem Relation Age of Onset  . Heart disease Father   . Hypertension Father   . Stroke Maternal Grandmother   . Stroke Maternal Grandfather   . Stroke Paternal Grandmother   . Depression Paternal Grandmother   . Stroke Paternal Grandfather     History   Social History  . Marital Status: Married    Spouse Name: N/A    Number of Children: N/A  . Years of Education: N/A   Occupational History  . Not on file.   Social History Main Topics  . Smoking status: Current Every Day Smoker -- 1.0 packs/day  . Smokeless tobacco: Not on file  . Alcohol Use: Yes     Comment: 3-4 drinks a month  . Drug Use: No  . Sexually Active: Yes   Other Topics Concern  . Not on file   Social History Narrative  . No narrative on file    Reviewed their medical, surgical, family, social, medication, and allergy history and updated chart as appropriate.    Objective:   Physical Exam  Filed Vitals:   10/23/12 1411  BP: 98/60  Pulse: 72  Temp: 98.2  F (36.8 C)  Resp: 16    General appearance: alert, no distress, WD/WN, seated, but seemingly in some pain Neck: supple, no lymphadenopathy, no thyromegaly, no masses Heart: RRR, normal S1, S2, no murmurs Lungs: CTA bilaterally, no wheezes, rhonchi, or rales Abdomen: +decreased bs, soft, moderate RLQ tenderness, +rebound tenderness, palpation in RUQ and suprapubic region refers pain to RLQ.  Otherwise non tender, non distended, no obvious masses, no hepatomegaly, no splenomegaly Back: tender along right low back, +CVA tenderness Pulses: 2+ symmetric   Assessment and Plan :     Encounter Diagnoses  Name Primary?  . RLQ abdominal pain Yes  . Anorexia   . Back pain    Urinalysis: Color, UA YELLOW Clarity, UA CLEAR Glucose, UA NEG Bilirubin, UA NEG Ketones, UA NEG Spec Grav, UA 1.015 Blood, UA NEG pH, UA 6.0 Protein, UA NEG Urobilinogen,  UA negative Nitrite, UA NEG Leukocytes, UA Negative  CBC    Component Value Date/Time   WBC 15.5* 10/23/2012 1500   RBC 4.68 10/23/2012 1500   HGB 13.6 10/23/2012 1500   HCT 41.4 10/23/2012 1500   PLT 210 10/23/2012 1500   MCV 88.4 10/23/2012 1500   MCH 29.1 10/23/2012 1500   MCHC 32.9 10/23/2012 1500   RDW 12.4 10/23/2012 1500   LYMPHSABS 2.4 10/23/2012 1500   MONOABS 0.3 10/23/2012 1500   EOSABS 0.2 08/04/2012 1632   BASOSABS 0.0 08/04/2012 1632    Urinalysis unremarkable. Upreg negative.  Discussed possible differential including appendicitis which is likely.  Will send for CT abdomen/pelvis now, stat CBC, advised NPO for now, and will await results.  If worse, go to the ED.  Will call her with results and plan in a few hours.

## 2012-10-23 NOTE — ED Notes (Signed)
Pt transported to surgery

## 2012-10-23 NOTE — H&P (Signed)
Stacy Mckinney is an 41 y.o. female.    General Surgery [facility]  Chief Complaint:  Abdominal pain, acute appendicitis  HPI: the patient is a 41 year old white female referred from her primary care physician's office for evaluation of abdominal pain and CT findings of acute appendicitis. The patient has been ill for approximately one week. She presented for evaluation and was treated for urinary tract infection. The pain became more severe in the right lower quadrant. Patient noted nausea and vomiting. She has noted weakness. Patient was seen at the office of her primary care physician and laboratory studies showed an elevated white blood cell count. Patient underwent CT scan of the abdomen and pelvis which was consistent with acute appendicitis. General surgery was called for evaluation and management.  Patient has a previous history of ovarian cystectomy on the right. She is treated for hypercholesterolemia. She has bipolar disorder.  Past Medical History  Diagnosis Date  . Bipolar disorder   . Tobacco use disorder   . FHx: cardiovascular disease   . Smoker   . H/O ovarian cystectomy   . Anxiety     Past Surgical History  Procedure Date  . Leep   . Laparoscopic ovarian cystectomy   . Elbow surgery     left elbow  . Rhinoplasty age 67    due to broken nose  . Tonsillectomy     age 10    Family History  Problem Relation Age of Onset  . Heart disease Father   . Hypertension Father   . Stroke Maternal Grandmother   . Stroke Maternal Grandfather   . Stroke Paternal Grandmother   . Depression Paternal Grandmother   . Stroke Paternal Grandfather    Social History:  reports that she has been smoking.  She quit smokeless tobacco use about 3 years ago. Her smokeless tobacco use included Snuff. She reports that she drinks alcohol. She reports that she does not use illicit drugs.  Allergies:  Allergies  Allergen Reactions  . Nitrous Oxide Other (See Comments)    Passes out     (Not in a hospital admission)  Results for orders placed during the hospital encounter of 10/23/12 (from the past 48 hour(s))  BASIC METABOLIC PANEL     Status: Normal   Collection Time   10/23/12  7:10 PM      Component Value Range Comment   Sodium 136  135 - 145 mEq/L    Potassium 3.6  3.5 - 5.1 mEq/L    Chloride 100  96 - 112 mEq/L    CO2 25  19 - 32 mEq/L    Glucose, Bld 84  70 - 99 mg/dL    BUN 11  6 - 23 mg/dL    Creatinine, Ser 1.61  0.50 - 1.10 mg/dL    Calcium 9.7  8.4 - 09.6 mg/dL    GFR calc non Af Amer >90  >90 mL/min    GFR calc Af Amer >90  >90 mL/min    Ct Abdomen Pelvis W Contrast  10/23/2012  *RADIOLOGY REPORT*  Clinical Data: Right lower quadrant abdominal pain since last night.  Anorexia.  Back pain.  Nausea.  History of right ovarian cyst.  CT ABDOMEN AND PELVIS WITH CONTRAST  Technique:  Multidetector CT imaging of the abdomen and pelvis was performed following the standard protocol during bolus administration of intravenous contrast.  Contrast: 30mL OMNIPAQUE IOHEXOL 300 MG/ML  SOLN, OMNIPAQUE IOHEXOL 300 MG/ML  SOLN  Comparison:  None.  Findings: Enlarged appendix in the upper right pelvis with a maximum diameter of 10.2 mm.  Associated mild diffuse wall thickening and enhancement with mild central low density in the proximal appendix.  Mild adjacent periappendiceal soft tissue stranding.  Intrauterine device in the expected position within the uterus. Bilateral ovarian follicles, larger and more numerous on the right. Unremarkable urinary bladder, kidneys, adrenal glands, pancreas, gallbladder, liver and spleen.  Clear lung bases.  Minimal lumbar and lower thoracic spine degenerative changes.  IMPRESSION: Changes of acute, uncomplicated appendicitis.  These results will be called to the ordering clinician or representative by the Radiologist Assistant, and communication documented in the PACS Dashboard.   Original Report Authenticated By: Beckie Salts, M.D.     Review of Systems  Constitutional: Positive for chills and malaise/fatigue. Negative for fever, weight loss and diaphoresis.  HENT: Negative.   Eyes: Negative.   Respiratory: Negative.   Cardiovascular: Negative.   Gastrointestinal: Positive for nausea, vomiting and abdominal pain. Negative for diarrhea, constipation, blood in stool and melena.  Genitourinary: Positive for urgency and frequency.  Musculoskeletal: Positive for back pain.  Skin: Negative.   Neurological: Negative.  Negative for weakness.  Endo/Heme/Allergies: Negative.   Psychiatric/Behavioral: Negative.     Blood pressure 132/76, pulse 85, temperature 98.2 F (36.8 C), temperature source Oral, resp. rate 18, SpO2 100.00%. Physical Exam  Constitutional: She is oriented to person, place, and time. She appears well-developed and well-nourished. No distress.  HENT:  Head: Normocephalic and atraumatic.  Right Ear: External ear normal.  Left Ear: External ear normal.  Mouth/Throat: Oropharynx is clear and moist.  Eyes: Conjunctivae normal are normal. Pupils are equal, round, and reactive to light. No scleral icterus.  Neck: Normal range of motion. Neck supple. No tracheal deviation present. No thyromegaly present.  Cardiovascular: Normal rate, regular rhythm and normal heart sounds.   No murmur heard. Respiratory: Effort normal and breath sounds normal. She has no wheezes.  GI: Soft. Bowel sounds are normal. She exhibits no distension and no mass. There is tenderness. There is no rebound and no guarding.  Musculoskeletal: Normal range of motion. She exhibits no edema.  Lymphadenopathy:    She has no cervical adenopathy.  Neurological: She is alert and oriented to person, place, and time.  Skin: Skin is warm and dry. She is not diaphoretic.  Psychiatric: She has a normal mood and affect. Her behavior is normal.     Assessment/Plan Acute appendicitis  I discussed with the patient and her husband the  indications for surgery. We discussed laparoscopic appendectomy. We discussed the possibility of conversion to open surgery. We discussed the need for postoperative antibiotic therapy. We discussed the hospital course to be anticipated. She understands and wishes to proceed. We will make arrangements with the operating room to proceed with laparoscopic appendectomy immediately.  The risks and benefits of the procedure have been discussed at length with the patient.  The patient understands the proposed procedure, potential alternative treatments, and the course of recovery to be expected.  All of the patient's questions have been answered at this time.  The patient wishes to proceed with surgery.  Velora Heckler, MD, [facility] Office: [phone]    Tanina Barb M 10/23/2012, 8:06 PM

## 2012-10-23 NOTE — Op Note (Signed)
OPERATIVE REPORT - LAPAROSCOPIC APPENDECTOMY  Preop diagnosis: Acute appendicitis  Postop diagnosis: Same  Procedure: Laparoscopic appendectomy  Surgeon:  Velora Heckler, MD, FACS  Anesthesia: General endotracheal  Estimated blood loss: Minimal  Preparation: Chlora-prep  Complications: None  Indications:  Patient is a 41 yo WF with less than 24 hour history of abdominal pain localized to the RLQ.  Seen by primary MD and WBC elevated at 15K.  CTA positive for acute appendicitis.  Patient seen in ER and prepared for operating room.  Procedure:  Patient is brought to the operating room and placed in a supine position on the operating room table. Following administration of general anesthesia, a time out was held and the patient's name and type of procedure is confirmed. Patient is then prepped and draped in the usual strict aseptic fashion.  After ascertaining that an adequate level of anesthesia been achieved, a peri-umbilical incision is made with a #15 blade. Dissection is carried down to the fascia. Fascia is incised in the midline and the peritoneal cavity is entered cautiously. A #0-vicryl pursestring suture is placed in the fascia. An Hassan cannula is introduced under direct vision and secured with the pursestring suture. Abdomen is insufflated with carbon dioxide. Laparoscope is introduced and the abdomen is explored. Operative ports are placed in the right upper quadrant and left lower quadrant. Appendix is identified. Mesoappendix is divided with the harmonic scalpel. Dissection is carried down to the base of the appendix. The base of the appendix at its junction with the cecal wall was clearly identified. Using an Endo GIA stapler the base of the appendix is transected at its junction with the cecal wall. There is good approximation of tissue along the staple line. There is good hemostasis along the staple line. The appendix is placed into an endo-catch bag and withdrawn through the  umbilical port without difficulty. The #0-vicryl pursestring suture is tied securely.  Right lower quadrant is irrigated with warm saline which is evacuated. Good hemostasis is noted. Ports are removed under direct vision. Good hemostasis is noted at the port sites. Pneumoperitoneum is released.  Skin incisions are anesthetized with local anesthetic. Wounds are closed with interrupted 4-0 Monocryl subcuticular sutures. Wounds were washed and dried and benzoin and Steri-Strips are applied. Sterile dressings are applied. Patient is awakened from anesthesia and brought to the recovery room. The patient tolerated the procedure well.  Velora Heckler, MD, [facility] Office: [phone]

## 2012-10-23 NOTE — ED Provider Notes (Signed)
History     CSN: [id number]  Arrival date & time 10/23/12  1818   First MD Initiated Contact with Patient 10/23/12 1849      No chief complaint on file.   (Consider location/radiation/quality/duration/timing/severity/associated sxs/prior treatment) The history is provided by the patient.  Stacy Mckinney is a 41 y.o. female hx of bipolar disorder here with ab pain. Had lower abdominal pain a week ago. Was thought to have UTI so finished 3 days of abx. Pain improved. However, around 4AM this morning, she had acute onset of RLQ pain. Had nausea and vomiting. Denies fevers or urinary symptoms. Last meal was noon. Had CT ab/pel done that showed appendicitis, sent for surgery eval.    Past Medical History  Diagnosis Date  . Bipolar disorder   . Tobacco use disorder   . FHx: cardiovascular disease   . Smoker   . H/O ovarian cystectomy     Past Surgical History  Procedure Date  . Leep   . Laparoscopic ovarian cystectomy     Family History  Problem Relation Age of Onset  . Heart disease Father   . Hypertension Father   . Stroke Maternal Grandmother   . Stroke Maternal Grandfather   . Stroke Paternal Grandmother   . Depression Paternal Grandmother   . Stroke Paternal Grandfather     History  Substance Use Topics  . Smoking status: Current Every Day Smoker -- 1.0 packs/day  . Smokeless tobacco: Not on file  . Alcohol Use: Yes     Comment: 3-4 drinks a month    OB History    Grav Para Term Preterm Abortions TAB SAB Ect Mult Living                  Review of Systems  Gastrointestinal: Positive for nausea, vomiting and abdominal pain.  All other systems reviewed and are negative.    Allergies  Nitrous oxide  Home Medications   Current Outpatient Rx  Name  Route  Sig  Dispense  Refill  . ALPRAZOLAM 0.25 MG PO TABS      One daily as needed for anxiety   30 tablet   0   . LAMICTAL 200 MG PO TABS   Oral   Take 1 tablet (200 mg total) by mouth daily.   90  tablet   3     Dispense as written.    Patient must have med check before anymore refills ...   . LEVONORGESTREL 20 MCG/24HR IU IUD   Intrauterine   1 each by Intrauterine route once.           Marland Kitchen FISH OIL 1000 MG PO CPDR   Oral   Take 1 capsule by mouth daily.           Marland Kitchen SIMVASTATIN 40 MG PO TABS   Oral   Take 1 tablet (40 mg total) by mouth at bedtime.   90 tablet   1   . VALACYCLOVIR HCL 500 MG PO TABS   Oral   Take 4 tablets (2,000 mg total) by mouth once.   12 tablet   1     BP 132/76  Pulse 85  Temp 98.2 F (36.8 C) (Oral)  Resp 18  SpO2 100%  Physical Exam  Nursing note and vitals reviewed. Constitutional: She is oriented to person, place, and time. She appears well-developed and well-nourished.  HENT:  Head: Normocephalic.  Mouth/Throat: Oropharynx is clear and moist.  Eyes: Conjunctivae normal are normal. Pupils  are equal, round, and reactive to light.  Neck: Normal range of motion. Neck supple.  Cardiovascular: Normal rate, regular rhythm and normal heart sounds.   Pulmonary/Chest: Effort normal and breath sounds normal. No respiratory distress. She has no wheezes. She has no rales.  Abdominal: Soft. Bowel sounds are normal.       + RLQ tenderness, mild LLQ tenderness as well. No rebound   Musculoskeletal: Normal range of motion.  Neurological: She is alert and oriented to person, place, and time.  Skin: Skin is warm and dry.  Psychiatric: She has a normal mood and affect. Her behavior is normal. Judgment and thought content normal.    ED Course  Procedures (including critical care time)   Labs Reviewed  BASIC METABOLIC PANEL   Ct Abdomen Pelvis W Contrast  10/23/2012  *RADIOLOGY REPORT*  Clinical Data: Right lower quadrant abdominal pain since last night.  Anorexia.  Back pain.  Nausea.  History of right ovarian cyst.  CT ABDOMEN AND PELVIS WITH CONTRAST  Technique:  Multidetector CT imaging of the abdomen and pelvis was performed following  the standard protocol during bolus administration of intravenous contrast.  Contrast: 30mL OMNIPAQUE IOHEXOL 300 MG/ML  SOLN, OMNIPAQUE IOHEXOL 300 MG/ML  SOLN  Comparison: None.  Findings: Enlarged appendix in the upper right pelvis with a maximum diameter of 10.2 mm.  Associated mild diffuse wall thickening and enhancement with mild central low density in the proximal appendix.  Mild adjacent periappendiceal soft tissue stranding.  Intrauterine device in the expected position within the uterus. Bilateral ovarian follicles, larger and more numerous on the right. Unremarkable urinary bladder, kidneys, adrenal glands, pancreas, gallbladder, liver and spleen.  Clear lung bases.  Minimal lumbar and lower thoracic spine degenerative changes.  IMPRESSION: Changes of acute, uncomplicated appendicitis.  These results will be called to the ordering clinician or representative by the Radiologist Assistant, and communication documented in the PACS Dashboard.   Original Report Authenticated By: Beckie Salts, M.D.      1. Appendicitis, acute       MDM  Stacy Mckinney is a 41 y.o. female here with RLQ pain with appendicitis on CT. I called Dr. Gerrit Friends, surgeon on call, who will admit the patient.          Richardean Canal, MD 10/23/12 Jerene Bears

## 2012-10-23 NOTE — Anesthesia Procedure Notes (Signed)
Procedure Name: Intubation Date/Time: 10/23/2012 8:48 PM Performed by: Leroy Libman L Patient Re-evaluated:Patient Re-evaluated prior to inductionOxygen Delivery Method: Circle system utilized Preoxygenation: Pre-oxygenation with 100% oxygen Intubation Type: IV induction, Cricoid Pressure applied and Rapid sequence Laryngoscope Size: Miller and 2 Grade View: Grade I Tube type: Oral Tube size: 7.5 mm Number of attempts: 1 Airway Equipment and Method: Stylet Placement Confirmation: ETT inserted through vocal cords under direct vision,  breath sounds checked- equal and bilateral and positive ETCO2 Secured at: 21 cm Tube secured with: Tape Dental Injury: Teeth and Oropharynx as per pre-operative assessment

## 2012-10-23 NOTE — ED Notes (Signed)
Pt states was being treated for urinary tract infection, went to take urine to doctor today to make sure the antibiotic treated it and when they did further test and a CT scan found pt to have appendicitis and was sent here for surgery. Pt having R sided pain. States had nausea/vomiting this am, decrease appetite.

## 2012-10-23 NOTE — Preoperative (Signed)
Beta Blockers   Reason not to administer Beta Blockers:Not Applicable 

## 2012-10-24 ENCOUNTER — Encounter (INDEPENDENT_AMBULATORY_CARE_PROVIDER_SITE_OTHER): Payer: Self-pay | Admitting: Internal Medicine

## 2012-10-24 ENCOUNTER — Encounter (HOSPITAL_COMMUNITY): Payer: Self-pay | Admitting: Surgery

## 2012-10-24 MED ORDER — ONDANSETRON HCL 4 MG PO TABS: 4.0000 mg | ORAL_TABLET | Freq: Four times a day (QID) | ORAL | Status: DC | PRN

## 2012-10-24 MED ORDER — HYDROCODONE-ACETAMINOPHEN 5-325 MG PO TABS: 1.0000 | ORAL_TABLET | ORAL | Status: DC | PRN

## 2012-10-24 NOTE — Progress Notes (Signed)
Assessment unchanged. Verbalized understanding of dc instructions including MyChart. Script x2 given as provided by MD. Discharged via wheelchair to front entrance to meet awaiting vehicle to carry home. Accompanied by husband and NT.

## 2012-10-24 NOTE — Care Management Note (Signed)
    Page 1 of 1   10/24/2012     11:16:33 AM   CARE MANAGEMENT NOTE 10/24/2012  Patient:  Stacy Mckinney, Stacy Mckinney   Account Number:  [id number]  Date Initiated:  10/24/2012  Documentation initiated by:  Lorenda Ishihara  Subjective/Objective Assessment:   41 yo female admitted s/p lap appy. PTA lived at home with spouse.     Action/Plan:   home when stable   Anticipated DC Date:  10/24/2012   Anticipated DC Plan:  HOME/SELF CARE      DC Planning Services  CM consult      Choice offered to / List presented to:             Status of service:  Completed, signed off Medicare Important Message given?   (If response is "NO", the following Medicare IM given date fields will be blank) Date Medicare IM given:   Date Additional Medicare IM given:    Discharge Disposition:  HOME/SELF CARE  Per UR Regulation:  Reviewed for med. necessity/level of care/duration of stay  If discussed at Long Length of Stay Meetings, dates discussed:    Comments:

## 2012-10-24 NOTE — Discharge Summary (Signed)
Patient seen and examined.  Agree with PA's note.  

## 2012-10-24 NOTE — Discharge Summary (Signed)
  Physician Discharge Summary  Patient ID: Stacy Mckinney MRN: [id number] DOB/AGE: [birth year] 41 y.o.  Admit date: 10/23/2012 Discharge date: 10/24/2012  Admitting Diagnosis: Acute Appendicitis  Discharge Diagnosis Acute Appendicitis  Consultants None  Procedures Laparoscopic Appendectomy  Hospital Course 41 yr old female who presented to [facility] with abdominal pain.  Workup showed appendicitis.  Patient was admitted and underwent procedure listed above.  Tolerated procedure well and was transferred to the floor.  Diet was advanced as tolerated.  On POD#1, the patient was voiding well, tolerating diet, ambulating well, pain well controlled, vital signs stable, incisions c/d/i and felt stable for discharge home.  Patient will follow up in our office in 2-3 weeks and knows to call with questions or concerns.    Medication List     As of 10/24/2012 10:18 AM    TAKE these medications         ALPRAZolam 0.25 MG tablet   Commonly known as: XANAX   Take 0.25 mg by mouth 3 (three) times daily as needed. One daily as needed for anxiety      HYDROcodone-acetaminophen 5-325 MG per tablet   Commonly known as: NORCO/VICODIN   Take 1-2 tablets by mouth every 4 (four) hours as needed.      LAMICTAL 200 MG tablet   Generic drug: lamoTRIgine   Take 1 tablet (200 mg total) by mouth daily.      ondansetron 4 MG tablet   Commonly known as: ZOFRAN   Take 1 tablet (4 mg total) by mouth every 6 (six) hours as needed for nausea.      simvastatin 40 MG tablet   Commonly known as: ZOCOR   Take 1 tablet (40 mg total) by mouth at bedtime.             Follow-up Information    Follow up with Velora Heckler, MD. In 3 weeks. (.  Please arrive to your appointment 30 minutes earlier than the scheduled time for check in procedures.   thank you)    Contact information:   [address] [place] [address] [phone]          Signed: Denny Levy [facility]  Surgery [phone]  10/24/2012, 10:18 AM

## 2012-10-30 ENCOUNTER — Telehealth (INDEPENDENT_AMBULATORY_CARE_PROVIDER_SITE_OTHER): Payer: Self-pay

## 2012-10-30 NOTE — Telephone Encounter (Signed)
Returning pts call re: appt. Per Dr Gerrit Friends appt made for 12-17 arrive at 2:45 for 3:00 appt.

## 2012-11-07 ENCOUNTER — Encounter (INDEPENDENT_AMBULATORY_CARE_PROVIDER_SITE_OTHER): Payer: Self-pay | Admitting: Surgery

## 2012-11-07 ENCOUNTER — Ambulatory Visit (INDEPENDENT_AMBULATORY_CARE_PROVIDER_SITE_OTHER): Payer: 59 | Admitting: Surgery

## 2012-11-07 VITALS — BP 116/70 | HR 72 | Temp 97.6°F | Resp 16 | Ht 64.0 in | Wt 189.0 lb

## 2012-11-07 DIAGNOSIS — K3580 Unspecified acute appendicitis: Secondary | ICD-10-CM

## 2012-11-07 NOTE — Progress Notes (Signed)
General Surgery [facility]  Visit Diagnoses: 1. Appendicitis, acute     HISTORY: Patient is a 41 year old white female who underwent laparoscopic appendectomy on 10/24/2012 for acute appendicitis. Final pathology confirms acute appendicitis. Postoperatively she has done well. She has had occasional episodes of nausea in the morning and is taking Zofran tablets with good symptomatic relief. She is having normal bowel movements. He is tolerating a regular diet. She denies any fevers or chills.  EXAM: Abdomen soft nontender without distention. Surgical wounds are well-healed. Right lower quadrant is soft and nontender without mass.  IMPRESSION: Status post laparoscopic appendectomy for acute appendicitis  PLAN: Patient will begin applying topical creams to her incisions. She is restricted to no heavy lifting for the next 2 weeks. After that she may resume all of her exercise and other physical activities. Patient will return as needed.  Velora Heckler, MD, FACS General & Endocrine Surgery [facility]

## 2012-11-07 NOTE — Patient Instructions (Signed)
  COCOA BUTTER & VITAMIN E CREAM  ([employer]mer[employer] or other brand)  Apply cocoa butter/vitamin E cream to your incision 2 - 3 times daily.  Massage cream into incision for one minute with each application.  Use sunscreen (50 SPF or higher) for first 6 months after surgery if area is exposed to sun.  You may substitute Mederma or other scar reducing creams as desired.   

## 2012-11-14 ENCOUNTER — Encounter (INDEPENDENT_AMBULATORY_CARE_PROVIDER_SITE_OTHER): Payer: 59 | Admitting: Surgery

## 2013-01-13 ENCOUNTER — Other Ambulatory Visit: Payer: Self-pay

## 2013-01-29 ENCOUNTER — Encounter: Payer: 59 | Admitting: Family Medicine

## 2013-02-05 ENCOUNTER — Ambulatory Visit (INDEPENDENT_AMBULATORY_CARE_PROVIDER_SITE_OTHER): Payer: 59 | Admitting: Family Medicine

## 2013-02-05 ENCOUNTER — Encounter: Payer: Self-pay | Admitting: Family Medicine

## 2013-02-05 VITALS — BP 120/74 | HR 80 | Wt 189.0 lb

## 2013-02-05 DIAGNOSIS — E785 Hyperlipidemia, unspecified: Secondary | ICD-10-CM

## 2013-02-05 DIAGNOSIS — F17200 Nicotine dependence, unspecified, uncomplicated: Secondary | ICD-10-CM

## 2013-02-05 DIAGNOSIS — F39 Unspecified mood [affective] disorder: Secondary | ICD-10-CM

## 2013-02-05 MED ORDER — DIAZEPAM 2 MG PO TABS: 2.0000 mg | ORAL_TABLET | Freq: Four times a day (QID) | ORAL | Status: DC | PRN

## 2013-02-05 MED ORDER — ROSUVASTATIN CALCIUM 5 MG PO TABS: 5.0000 mg | ORAL_TABLET | Freq: Every day | ORAL | Status: DC

## 2013-02-05 NOTE — Progress Notes (Signed)
  Subjective:    Patient ID: Stacy Mckinney, female    DOB: Nov 18, 1971, 42 y.o.   MRN: [id number]  HPI She is here for a medication management check. She has been on labetalol for 15 years and doing quite nicely. She does run into trouble when she misses a dose. She is now a little out of sorts because of altered dosing schedule. She also complains of difficulty taking simvastatin. She notes that this has occurred since she had her appendectomy. Approximately one hour after taking it she will get nauseated , have abdominal cramping. She also takes Xanax and she is taken sick since November. She does not like the way they feel in that they cause aching and she sleeps. He has taken Valium in the past to help with anxiety and would like a prescription. She also continues to smoke and has expressed some interest in quitting sometime in the near future.   Review of Systems     Objective:   Physical Exam Alert and in no distress but slightly out of sorts and appropriately dressed.       Assessment & Plan:  Mood disorder - Plan: diazepam (VALIUM) 2 MG tablet  Hyperlipidemia LDL goal < 100 - Plan: rosuvastatin (CRESTOR) 5 MG tablet  Smoker she is to call me in several days to let me know how she's tolerating the Crestor. We will discuss smoking cessation at a later date.

## 2013-02-26 ENCOUNTER — Telehealth: Payer: Self-pay | Admitting: Family Medicine

## 2013-02-26 DIAGNOSIS — E785 Hyperlipidemia, unspecified: Secondary | ICD-10-CM

## 2013-02-26 MED ORDER — VALACYCLOVIR HCL 500 MG PO TABS: 500.0000 mg | ORAL_TABLET | ORAL | Status: DC | PRN

## 2013-02-26 MED ORDER — ROSUVASTATIN CALCIUM 5 MG PO TABS: 5.0000 mg | ORAL_TABLET | Freq: Every day | ORAL | Status: DC

## 2013-02-26 NOTE — Telephone Encounter (Signed)
Crestor and Valtrex were called in

## 2013-03-09 ENCOUNTER — Telehealth: Payer: Self-pay | Admitting: Internal Medicine

## 2013-03-09 DIAGNOSIS — E785 Hyperlipidemia, unspecified: Secondary | ICD-10-CM

## 2013-03-09 MED ORDER — ROSUVASTATIN CALCIUM 5 MG PO TABS: 5.0000 mg | ORAL_TABLET | Freq: Every day | ORAL | Status: DC

## 2013-03-09 NOTE — Telephone Encounter (Signed)
Sent crestor to pharmacy. It was done as a sample instead of since to a pharmacy

## 2013-06-19 ENCOUNTER — Other Ambulatory Visit: Payer: Self-pay | Admitting: Family Medicine

## 2013-06-20 NOTE — Telephone Encounter (Signed)
Is this ok?

## 2013-06-20 NOTE — Telephone Encounter (Signed)
Go ahead and renew this 

## 2013-08-17 ENCOUNTER — Other Ambulatory Visit: Payer: Self-pay | Admitting: Family Medicine

## 2013-08-17 DIAGNOSIS — M542 Cervicalgia: Secondary | ICD-10-CM

## 2013-08-31 ENCOUNTER — Ambulatory Visit
Admission: RE | Admit: 2013-08-31 | Discharge: 2013-08-31 | Disposition: A | Discharge status: Home / Self Care | Payer: 59 | Source: Ambulatory Visit | Attending: Family Medicine | Admitting: Family Medicine

## 2013-08-31 DIAGNOSIS — M542 Cervicalgia: Secondary | ICD-10-CM

## 2013-08-31 MED ORDER — TRIAMCINOLONE ACETONIDE 40 MG/ML IJ SUSP (RADIOLOGY)
0.3000 mg | Freq: Once | INTRAMUSCULAR | Status: AC
  Administered 2013-08-31: 0.4 mg via INTRA_ARTICULAR

## 2013-08-31 MED ORDER — IOHEXOL 300 MG/ML  SOLN
1.0000 mL | Freq: Once | INTRAMUSCULAR | Status: AC | PRN
  Administered 2013-08-31: 1 mL via INTRA_ARTICULAR

## 2013-10-04 ENCOUNTER — Other Ambulatory Visit: Payer: Self-pay

## 2013-10-18 ENCOUNTER — Telehealth: Payer: Self-pay | Admitting: Family Medicine

## 2013-10-22 ENCOUNTER — Telehealth: Payer: Self-pay | Admitting: Internal Medicine

## 2013-10-22 DIAGNOSIS — F39 Unspecified mood [affective] disorder: Secondary | ICD-10-CM

## 2013-10-22 MED ORDER — LAMICTAL 200 MG PO TABS: 200.0000 mg | ORAL_TABLET | Freq: Every day | ORAL | Status: DC

## 2013-10-22 NOTE — Telephone Encounter (Signed)
PT informed needs appt per [facility]

## 2013-10-22 NOTE — Telephone Encounter (Signed)
Refill request for Lamictal 200mg  #90 to [facility]

## 2013-10-23 ENCOUNTER — Ambulatory Visit (INDEPENDENT_AMBULATORY_CARE_PROVIDER_SITE_OTHER): Payer: 59 | Admitting: Family Medicine

## 2013-10-23 ENCOUNTER — Other Ambulatory Visit: Payer: Self-pay | Admitting: Family Medicine

## 2013-10-23 VITALS — BP 120/80 | HR 90 | Wt 194.0 lb

## 2013-10-23 DIAGNOSIS — F39 Unspecified mood [affective] disorder: Secondary | ICD-10-CM

## 2013-10-23 DIAGNOSIS — L02419 Cutaneous abscess of limb, unspecified: Secondary | ICD-10-CM

## 2013-10-23 DIAGNOSIS — L02415 Cutaneous abscess of right lower limb: Secondary | ICD-10-CM

## 2013-10-23 DIAGNOSIS — L03119 Cellulitis of unspecified part of limb: Secondary | ICD-10-CM

## 2013-10-23 MED ORDER — LAMICTAL 200 MG PO TABS: 200.0000 mg | ORAL_TABLET | Freq: Every day | ORAL | Status: DC

## 2013-10-23 NOTE — Telephone Encounter (Signed)
DR.LALONDE IS THIS OK 

## 2013-10-23 NOTE — Telephone Encounter (Signed)
Okay to renew

## 2013-10-23 NOTE — Progress Notes (Signed)
  Subjective:    Patient ID: Stacy Mckinney, female    DOB: [birth year], 42 y.o.   MRN: [id number]  HPI She is here for a medication check. She has a mood disorder that was diagnosed in 1997. She presently is on Lamictal and is doing quite well on this. She does note that if she skips a dose, she will have withdrawal symptoms. She has been in counseling in the past and has learned some very valuable life lessons on how to deal with drauma and trauma. She also has 2 lesions on the right inner thigh that have been painful but are slowly improving.   Review of Systems     Objective:   Physical Exam  alert and in no distress. One lesion is slightly erythematous in the right upper thigh and about one and half centimeters in size. The other is smaller and more roundish.        Assessment & Plan:  Mood disorder - Plan: LAMICTAL 200 MG tablet  Abscess of right thigh  she will continue on her Lamictal since it is working well. She has a very good handle on her life in the stresses in her life. Recommended heat for the resolving abscesses and if they get worse, return here for possible I and D.

## 2013-12-17 IMAGING — CT CT ABD-PELV W/ CM
2 of 5 series · 17 of 46 positions shown, 19 images · IV contrast (30CC OMNI 300 & [ID] OMNI 300)
Comparison: None.

CLINICAL DATA: Right lower quadrant abdominal pain since last
night.  Anorexia.  Back pain.  Nausea.  History of right ovarian
cyst.

CT ABDOMEN AND PELVIS WITH CONTRAST
TECHNIQUE: Multidetector CT imaging of the abdomen and pelvis was
performed following the standard protocol during bolus
administration of intravenous contrast.
Contrast: 30mL OMNIPAQUE IOHEXOL 300 MG/ML  SOLN, 100mL OMNIPAQUE
IOHEXOL 300 MG/ML  SOLN

[Series 2: abd/pelvis with · axial · 0.80mm/px · z∈[-384,-4]mm · 14 of 86 slices shown, 16 images]
[im 5/86  soft-tissue]
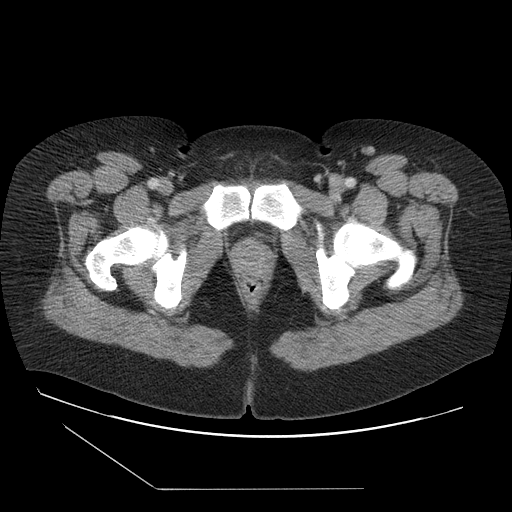
[im 5/86  bone]
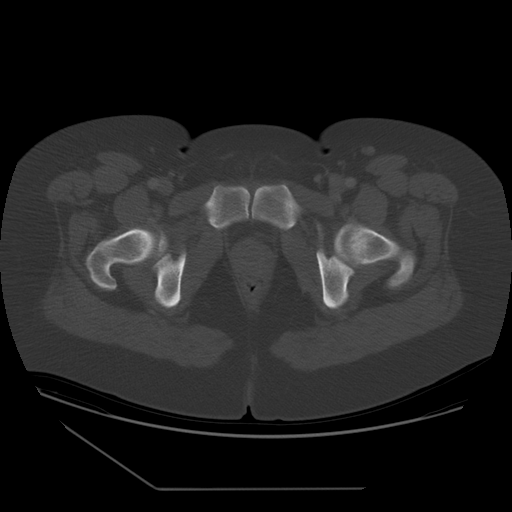
[im 9/86  soft-tissue]
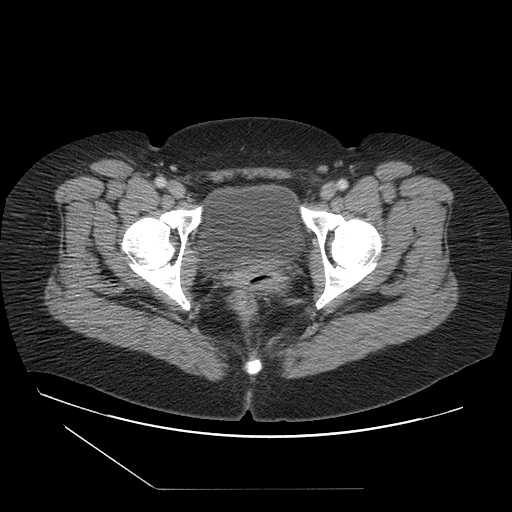
[im 18/86  soft-tissue]
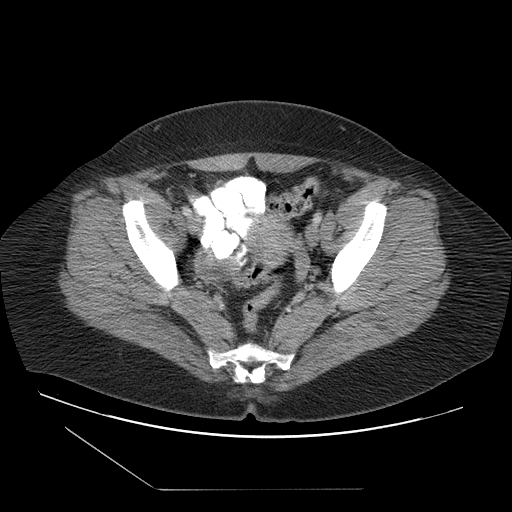
[im 23/86  soft-tissue]
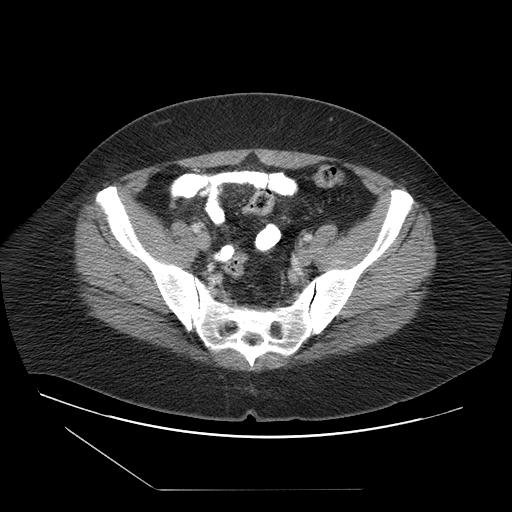
[im 27/86  soft-tissue]
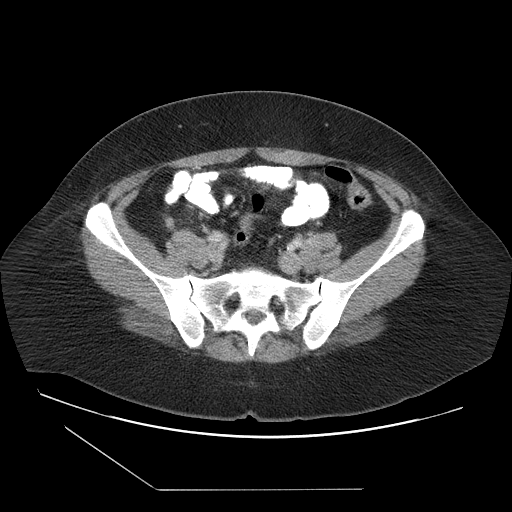
[im 36/86  soft-tissue]
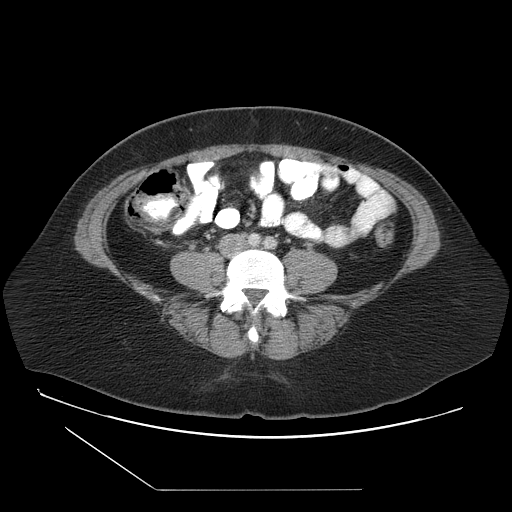
[im 41/86  soft-tissue]
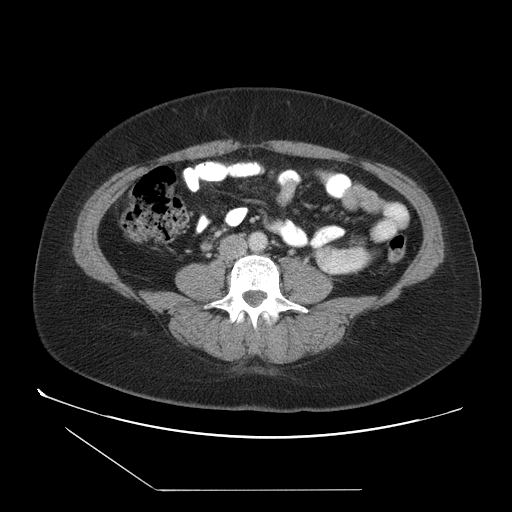
[im 45/86  soft-tissue]
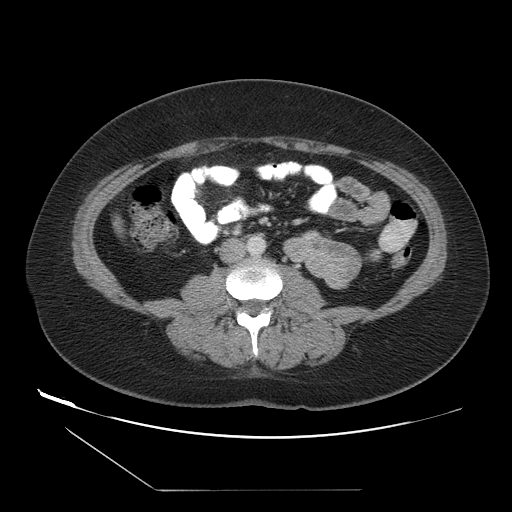
[im 50/86  soft-tissue]
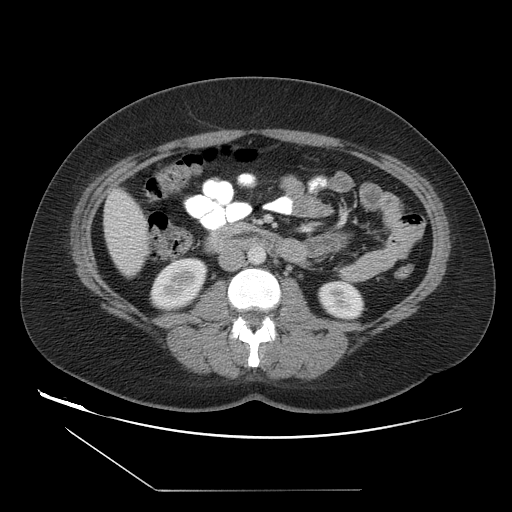
[im 50/86  bone]
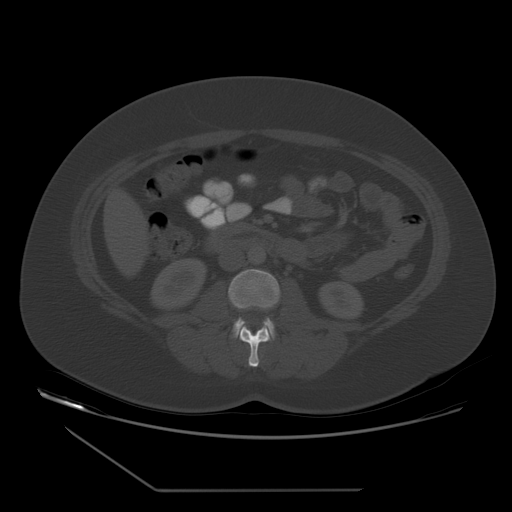
[im 59/86  soft-tissue]
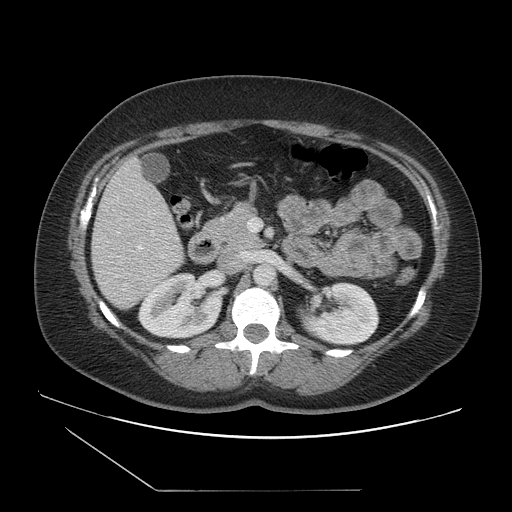
[im 63/86  soft-tissue]
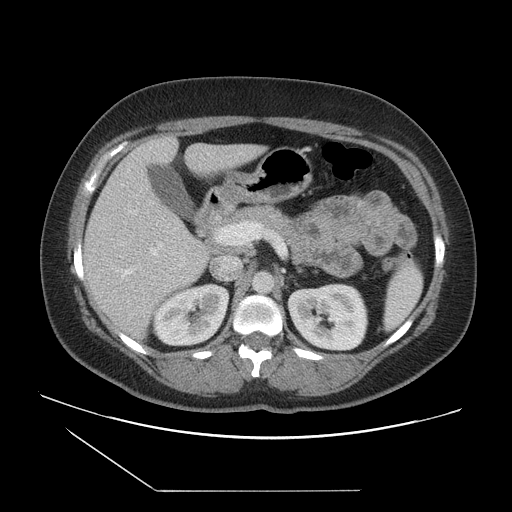
[im 68/86  soft-tissue]
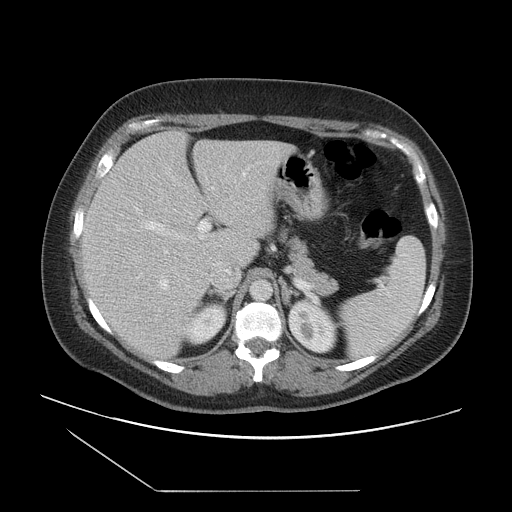
[im 77/86  soft-tissue]
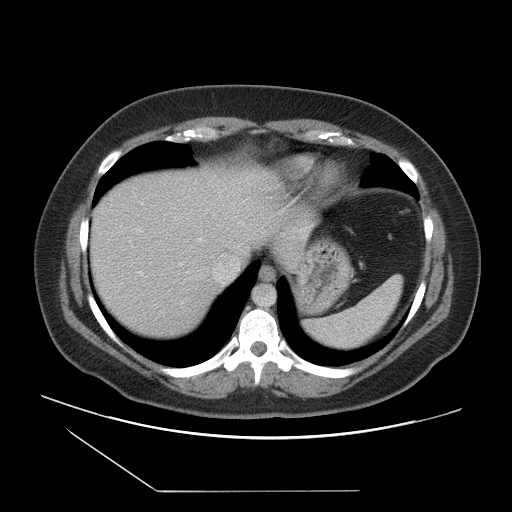
[im 81/86  soft-tissue]
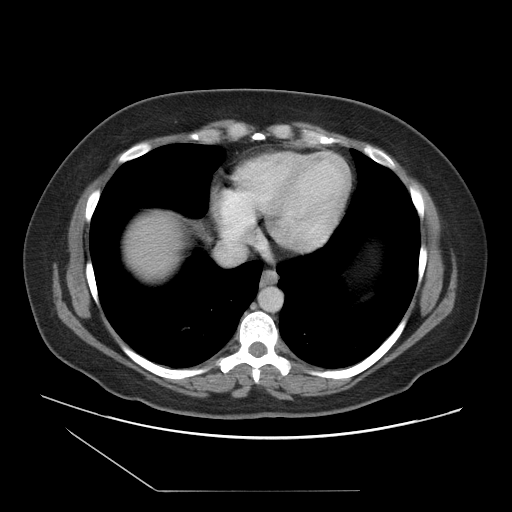

[Series 400: coronal · coronal · 0.93mm/px · 3 of 115 slices shown]
[im 39/115  soft-tissue]
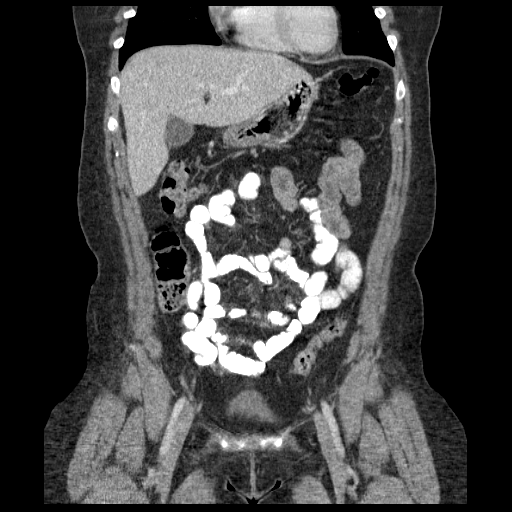
[im 51/115  soft-tissue]
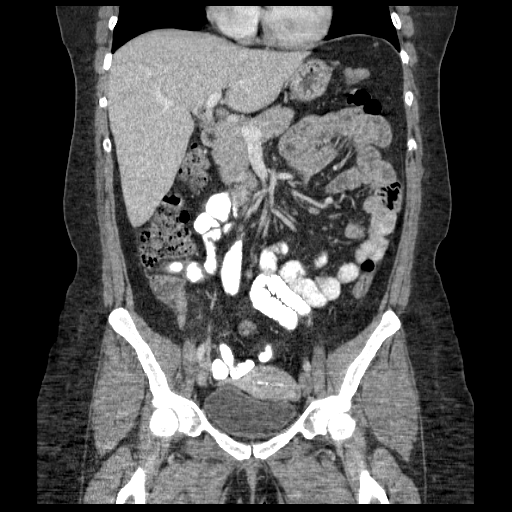
[im 64/115  soft-tissue]
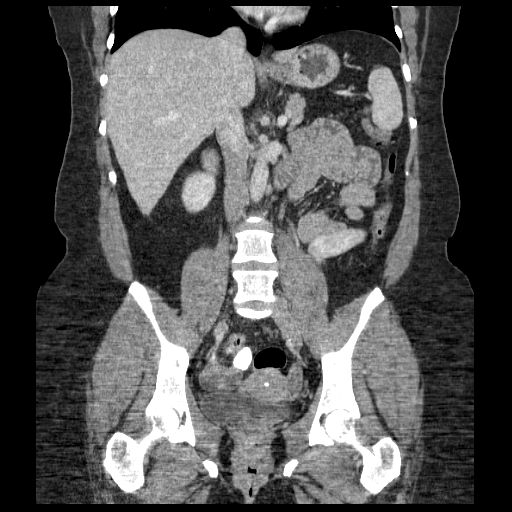

[17 of 46 positions shown; findings below may reference images not displayed]

FINDINGS: Enlarged appendix in the upper right pelvis with a
maximum diameter of 10.2 mm.  Associated mild diffuse wall
thickening and enhancement with mild central low density in the
proximal appendix.  Mild adjacent periappendiceal soft tissue
stranding.

Intrauterine device in the expected position within the uterus.
Bilateral ovarian follicles, larger and more numerous on the right.
Unremarkable urinary bladder, kidneys, adrenal glands, pancreas,
gallbladder, liver and spleen.  Clear lung bases.  Minimal lumbar
and lower thoracic spine degenerative changes.
IMPRESSION: Changes of acute, uncomplicated appendicitis.

These results will be called to the ordering clinician or
representative by the Radiologist Assistant, and communication
documented in the PACS Dashboard.

## 2014-01-18 ENCOUNTER — Encounter: Payer: Self-pay | Admitting: Medical

## 2014-01-18 ENCOUNTER — Telehealth: Payer: Self-pay | Admitting: Medical

## 2014-01-18 ENCOUNTER — Ambulatory Visit (INDEPENDENT_AMBULATORY_CARE_PROVIDER_SITE_OTHER): Payer: 59 | Admitting: Medical

## 2014-01-18 VITALS — BP 110/80 | HR 90 | Temp 97.5°F | Resp 16 | Wt 187.0 lb

## 2014-01-18 DIAGNOSIS — M542 Cervicalgia: Secondary | ICD-10-CM

## 2014-01-18 NOTE — Telephone Encounter (Signed)
Message copied by Drucilla Schmidt on Fri Jan 18, 2014 11:38 AM ------      Message from: Carlena Hurl      Created: Fri Jan 18, 2014 10:17 AM       Please call her and apologize for the long wait today.  I had a physical and a med check, both complicated, before her and this got me behind.              If she wants to return this afternoon, we have openings and/or could work her in right at 1:30. ------

## 2014-01-18 NOTE — Progress Notes (Signed)
Pt was brought back but had to leave before provider could see her.  Time constraint issue.

## 2014-01-18 NOTE — Telephone Encounter (Signed)
Someone please call to see if we can work her in next week early.  Again, i apologize we didn't get to her in time today given the med check and physicals before her that went a little long.

## 2014-01-21 NOTE — Telephone Encounter (Signed)
Pt has appt

## 2014-01-25 ENCOUNTER — Encounter: Payer: Self-pay | Admitting: Medical

## 2014-01-25 ENCOUNTER — Telehealth: Payer: Self-pay | Admitting: Internal Medicine

## 2014-01-25 ENCOUNTER — Ambulatory Visit (INDEPENDENT_AMBULATORY_CARE_PROVIDER_SITE_OTHER): Payer: 59 | Admitting: Medical

## 2014-01-25 VITALS — BP 130/90 | HR 64 | Temp 98.2°F | Resp 16 | Wt 195.0 lb

## 2014-01-25 DIAGNOSIS — IMO0002 Reserved for concepts with insufficient information to code with codable children: Secondary | ICD-10-CM

## 2014-01-25 DIAGNOSIS — Z8249 Family history of ischemic heart disease and other diseases of the circulatory system: Secondary | ICD-10-CM

## 2014-01-25 DIAGNOSIS — M542 Cervicalgia: Secondary | ICD-10-CM

## 2014-01-25 DIAGNOSIS — L608 Other nail disorders: Secondary | ICD-10-CM

## 2014-01-25 DIAGNOSIS — R42 Dizziness and giddiness: Secondary | ICD-10-CM

## 2014-01-25 DIAGNOSIS — F17200 Nicotine dependence, unspecified, uncomplicated: Secondary | ICD-10-CM | POA: Insufficient documentation

## 2014-01-25 DIAGNOSIS — S6390XA Sprain of unspecified part of unspecified wrist and hand, initial encounter: Secondary | ICD-10-CM

## 2014-01-25 DIAGNOSIS — S63619A Unspecified sprain of unspecified finger, initial encounter: Secondary | ICD-10-CM

## 2014-01-25 DIAGNOSIS — L609 Nail disorder, unspecified: Secondary | ICD-10-CM

## 2014-01-25 DIAGNOSIS — R079 Chest pain, unspecified: Secondary | ICD-10-CM

## 2014-01-25 NOTE — Progress Notes (Signed)
Subjective:   Stacy Mckinney is a 43 y.o. female presenting on 01/25/2014 with multiple issues  This is a follow up from last week she left last week without being seen as we were behind.  Of note, I started the conversation today asking her to give me her list of problems based on most concerning to her as she has multiple concerns.  She waited til the last concern of many to tell me she is having chest pain.  Unfortunately her most important concern was her last concern (chest pain) after we had spent 30 minutes discussing the other concerns.  Chest pain - she notes last few weeks with chest pain that seems to be located under her breast bilateral, but usually worse on the left side of her chest.  She seems to have sort of underlying chest pain all the time in the last few weeks, but exercise causes her to get dizzy.  She denies chest pain specifically with exercise, no associated sweats, nausea, syncope, palpitations. Chest pain is not worsened with food.  Denies dyspnea.   Her cardiac risk factors include being a smoker since teenage years, premature heart disease history in father, significant heart disease on both sides of the family, hyperlipidemia, obesity. She denies prior stress test, but has had an EKG here.  Heart disease in family includes father, PGM, MGF, PGF, and MGM had stroke.   She notes right great got stumped few months ago, since then been growing out but the nail flakes off and crack down the middle.  Her bilateral great toenail seemed to been growing at times.    She jammed her right index finger last week when her dog ran into her finger on end.  She has been using ice, buddy taping at nighttime, aspirin over-the-counter.   She notes ongoing neck pains. She has a history of bulging disc in her neck at C5-6. Was getting epidural steroid injections about 1-2 yearly by Dr. Derry Lory, ortho.  Orthopedics.  These help for several months at a time.  Wants to get another. The last one was  through [facility]. Denies numbness, weakness, tingling in arms or legs.  She does get headaches on the right side from time to time.  Review of Systems ROS as in subjective      Objective:    BP 130/90  Pulse 64  Temp(Src) 98.2 F (36.8 C) (Oral)  Resp 16  Wt 195 lb (88.451 kg)  General appearance: alert, no distress, WD/WN Neck: supple, mild lower neck midline tenderness posteriorly, ROM mildly reduced with extension and flexion, otherwise no lymphadenopathy, no thyromegaly, no masses, no bruits Chest wall nontender Heart: premature/ectopic beats palpated, otherwise normal S1, S2, no murmurs Lungs: CTA bilaterally, no wheezes, rhonchi, or rales Abdomen: +bs, soft, non tender, non distended, no masses, no hepatomegaly, no splenomegaly Pulses: 2+ symmetric, upper and lower extremities, normal cap refill MSK: right index finger at PIP with mild swelling and tendnerss, but normal ROM, normal strength.   Rest of hand and arm exam unremarkable Skin: right great toenail thickened, yellow white, crack down the middle of the nail, some ingrowing, left great toenail with mild thickened and similar discoloration Neuro: nonfocal, CN2-12 intact, normal UE and LE strength, sensation and DTRs Ext: no edema, no clubbing or cyanosis   Adult ECG Report  Indication: dizziness with exertion, chest pain  Rate: 75bpm  Rhythm: premature ventricular contractions (PVC), sinus rhythm otherwise, frequent PVCs  QRS Axis: 52 degrees  PR Interval: 145ms  QRS Duration: 132ms  QTc: 430ms  Conduction Disturbances: incomplete RBBB  Other Abnormalities: none  Patient's cardiac risk factors are: dyslipidemia, family history of premature cardiovascular disease, obesity (BMI >= 30 kg/m2), sedentary lifestyle and smoking/ tobacco exposure.  EKG comparison: 06/2010  Narrative Interpretation: new PVCs, otherwise no other acute changes        Assessment: Encounter Diagnoses    Name Primary?  . Chest pain Yes  . Family history of premature CAD   . Tobacco use disorder   . Dizziness and giddiness   . Sprain of finger, right   . Toenail deformity   . Cervicalgia   . Bulging disc      Plan: Chest pain, family hx/o premature CAD in family, heart disease in family both sides - I discussed her symptoms and EKG with supervising physician Dr. Redmond School.  Her chest pain is somewhat atypical, but she does get dizzy with exertion, has several cardiac risk factors.  Advised if any worsening symptoms over the weekend to call 911 or go to the ED promptly. Otherwise, we will try to get her in to cardiology early next week.   Tobacco use - advised the need to stop tobacco Dizziness - cardiology referral Sprain of finger - advised rest, buddy tape to middle finger along with use of splint OTC, ice daily, Aleve OTC, and recheck in 1wk if not improving Toenail deformity - referral to podiatry Cervicalgia, bulging disc - will readdress after her cardiac symptoms get addressed.  Stacy Mckinney was seen today for multiple issues.  Diagnoses and associated orders for this visit:  Chest pain  Family history of premature CAD  Tobacco use disorder  Dizziness and giddiness  Sprain of finger, right  Toenail deformity - Ambulatory referral to Podiatry  Cervicalgia  Bulging disc     Return pending referral.

## 2014-01-25 NOTE — Telephone Encounter (Signed)
Message copied by Florestine Avers on Fri Jan 25, 2014  4:49 PM ------      Message from: Carlena Hurl      Created: Fri Jan 25, 2014  4:41 PM       1) refer to Dr. Wynonia Lawman or other cardiology, preferably Monday or Friday afternoon.  She is out of town Tuesday through Thursday.            Referral for dizziness with exertion, chest pain, somewhat atypical, several cardiac risk factors, abnormal EKG            2) refer to podiatry for toenail deformity, but make this a few weeks out ------

## 2014-01-25 NOTE — Patient Instructions (Addendum)
Right index finger sprain  Use an OTC metal splint, buddy tape the index finger to the finger.  Splint during the day time.  Use ice 20 minutes on, several times daily if possible  Use Aleve twice daily  Rest the finger, don't reinjure the finger, and consider recheck in 1 week if not improving  Toe nail - we will refer to podiatry   SYMPTOMS OF A HEART ATTACK The most common signs and symptoms include:   Tightness or squeezing in the chest.   Feeling of heaviness on the chest.   Discomfort in the arms, neck, or jaw.   Shortness of breath and nausea.   Cold, wet skin.   Chest pain or discomfort brought on by physical effort or excitement which increase the oxygen needs of the heart.   SEEK IMMEDIATE MEDICAL CARE IF:   You develop nausea, vomiting, or shortness of breath.   You feel faint, lightheaded, or pass out.   Your chest discomfort gets worse.   You are sweating or experience sudden profound fatigue.   Your discomfort lasts longer than 15 minutes.   If you have a history of heart disease or angina, you should tell your caregiver right away about any increase in the severity or frequency of your chest discomfort or angina attacks. When you have angina, you should stop what you are doing and sit down. This may bring relief in 3 to 5 minutes. If your caregiver has prescribed nitro, take it as directed.   WHAT IS A HEART ATTACK: Myocardial Infarction A myocardial infarction (MI) is damage to the heart that is not reversible. It is also called a heart attack. An MI usually occurs when a heart (coronary) artery becomes blocked or narrowed. This cuts off the blood supply to the heart. When one or more of the heart (coronary) arteries becomes blocked, that area of the heart begins to die. This causes pain felt during an MI.  If you think you might be having an MI, call your local emergency services immediately (911 in [place] It is recommended that you take a 162 mg  non-enteric coated aspirin if you do not have an aspirin allergy. Do not drive yourself to the hospital or wait to see if your symptoms go away. The sooner MI is treated, the greater the amount of heart muscle saved. Time is muscle. It can save your life. CAUSES  An MI can occur from:  A gradual buildup of a fatty substance called plaque. When plaque builds up in the arteries, this condition is called atherosclerosis. This buildup can block or reduce the blood supply to the heart artery(s).   A sudden plaque rupture within a heart artery that causes a blood clot (thrombus). A blood clot can block the heart artery which does not allow blood flow to the heart.   A severe tightening (spasm) of the heart artery. This is a less common cause of a heart attack. When a heart artery spasms, it cuts off blood flow through the artery. Spasms can occur in heart arteries that do not have atherosclerosis.  RISK FACTORS People at risk for an MI usually have one or more risk factors, such as:  High blood pressure.   High cholesterol.   Smoking.   Gender. Men have a higher heart attack risk.   Overweight/obesity.   Age.   Family history.   Lack of exercise.   Diabetes.   Stress.   Excessive alcohol use.   Street drug use (  cocaine and methamphetamines).

## 2014-01-25 NOTE — Telephone Encounter (Signed)
Dr. Thurman Coyer office is closed today. We will need to call back on Monday. Both referrals need to be done

## 2014-01-28 ENCOUNTER — Other Ambulatory Visit: Payer: Self-pay | Admitting: Family Medicine

## 2014-01-28 ENCOUNTER — Telehealth: Payer: Self-pay | Admitting: Family Medicine

## 2014-01-28 DIAGNOSIS — R9431 Abnormal electrocardiogram [ECG] [EKG]: Secondary | ICD-10-CM

## 2014-01-28 MED ORDER — DIAZEPAM 2 MG PO TABS: ORAL_TABLET | ORAL | Status: DC

## 2014-01-28 NOTE — Progress Notes (Signed)
What medication do i need to call in?

## 2014-01-28 NOTE — Telephone Encounter (Signed)
Patient is aware of her appointment to see Dr. Wynonia Lawman on 02/01/14 @ 145 pm and I fax over her to the foot center and they will contact her for her appointment. CLS

## 2014-01-28 NOTE — Telephone Encounter (Signed)
Medication called in 

## 2014-01-28 NOTE — Telephone Encounter (Signed)
Is this okay to refill? 

## 2014-01-28 NOTE — Progress Notes (Signed)
Xanax and should have been called in. Check with the front office concerning that.

## 2014-01-28 NOTE — Telephone Encounter (Signed)
Go ahead and renew this

## 2014-01-28 NOTE — Telephone Encounter (Signed)
Patient is aware of her appointment to see Dr. Wynonia Lawman. CLS Friday 02/01/14 @ 145 pm. CLS

## 2014-01-28 NOTE — Telephone Encounter (Signed)
Message copied by Armanda Magic on Mon Jan 28, 2014  4:45 PM ------      Message from: Carlena Hurl      Created: Fri Jan 25, 2014  4:41 PM       1) refer to Dr. Wynonia Lawman or other cardiology, preferably Monday or Friday afternoon.  She is out of town Tuesday through Thursday.            Referral for dizziness with exertion, chest pain, somewhat atypical, several cardiac risk factors, abnormal EKG            2) refer to podiatry for toenail deformity, but make this a few weeks out ------

## 2014-01-28 NOTE — Progress Notes (Signed)
Pt is not on xanax. Pt is on valium which was called in to pharmacy.

## 2014-02-01 ENCOUNTER — Ambulatory Visit
Admission: RE | Admit: 2014-02-01 | Discharge: 2014-02-01 | Disposition: A | Discharge status: Home / Self Care | Payer: 59 | Source: Ambulatory Visit | Attending: Cardiology | Admitting: Cardiology

## 2014-02-01 ENCOUNTER — Other Ambulatory Visit: Payer: Self-pay | Admitting: Cardiology

## 2014-02-01 DIAGNOSIS — R0789 Other chest pain: Secondary | ICD-10-CM

## 2014-02-05 ENCOUNTER — Other Ambulatory Visit: Payer: Self-pay | Admitting: Cardiology

## 2014-02-05 DIAGNOSIS — R918 Other nonspecific abnormal finding of lung field: Secondary | ICD-10-CM

## 2014-02-22 ENCOUNTER — Other Ambulatory Visit: Payer: 59

## 2014-02-27 ENCOUNTER — Ambulatory Visit
Admission: RE | Admit: 2014-02-27 | Discharge: 2014-02-27 | Disposition: A | Discharge status: Home / Self Care | Payer: 59 | Source: Ambulatory Visit | Attending: Cardiology | Admitting: Cardiology

## 2014-02-27 DIAGNOSIS — R918 Other nonspecific abnormal finding of lung field: Secondary | ICD-10-CM

## 2014-03-20 ENCOUNTER — Encounter: Payer: Self-pay | Admitting: Family Medicine

## 2014-04-01 ENCOUNTER — Ambulatory Visit (INDEPENDENT_AMBULATORY_CARE_PROVIDER_SITE_OTHER): Payer: 59 | Admitting: Family Medicine

## 2014-04-01 ENCOUNTER — Other Ambulatory Visit (HOSPITAL_COMMUNITY)
Admission: RE | Admit: 2014-04-01 | Discharge: 2014-04-01 | Disposition: A | Discharge status: Home / Self Care | Payer: 59 | Source: Ambulatory Visit | Attending: Family Medicine | Admitting: Family Medicine

## 2014-04-01 ENCOUNTER — Encounter: Payer: Self-pay | Admitting: Family Medicine

## 2014-04-01 VITALS — BP 112/80 | HR 64 | Ht 64.0 in | Wt 193.0 lb

## 2014-04-01 DIAGNOSIS — Z1151 Encounter for screening for human papillomavirus (HPV): Secondary | ICD-10-CM | POA: Insufficient documentation

## 2014-04-01 DIAGNOSIS — E785 Hyperlipidemia, unspecified: Secondary | ICD-10-CM

## 2014-04-01 DIAGNOSIS — F17200 Nicotine dependence, unspecified, uncomplicated: Secondary | ICD-10-CM

## 2014-04-01 DIAGNOSIS — B001 Herpesviral vesicular dermatitis: Secondary | ICD-10-CM

## 2014-04-01 DIAGNOSIS — Z0000 Encounter for general adult medical examination without abnormal findings: Secondary | ICD-10-CM

## 2014-04-01 DIAGNOSIS — B009 Herpesviral infection, unspecified: Secondary | ICD-10-CM

## 2014-04-01 DIAGNOSIS — Z8742 Personal history of other diseases of the female genital tract: Secondary | ICD-10-CM

## 2014-04-01 DIAGNOSIS — Z01419 Encounter for gynecological examination (general) (routine) without abnormal findings: Secondary | ICD-10-CM | POA: Insufficient documentation

## 2014-04-01 DIAGNOSIS — F39 Unspecified mood [affective] disorder: Secondary | ICD-10-CM

## 2014-04-01 DIAGNOSIS — Z8249 Family history of ischemic heart disease and other diseases of the circulatory system: Secondary | ICD-10-CM

## 2014-04-01 DIAGNOSIS — M5020 Other cervical disc displacement, unspecified cervical region: Secondary | ICD-10-CM

## 2014-04-01 MED ORDER — SIMVASTATIN 20 MG PO TABS: 20.0000 mg | ORAL_TABLET | ORAL | Status: DC

## 2014-04-01 MED ORDER — DIAZEPAM 2 MG PO TABS: ORAL_TABLET | ORAL | Status: DC

## 2014-04-01 MED ORDER — LAMICTAL 200 MG PO TABS: 200.0000 mg | ORAL_TABLET | Freq: Every day | ORAL | Status: DC

## 2014-04-01 NOTE — Progress Notes (Signed)
Subjective:    Patient ID: Stacy Mckinney, female    DOB: Jan 21, 1971, 43 y.o.   MRN: [id number]  Ms. Stacy Mckinney is a very pleasant 43 y.o. yo female who  has a past medical history of Bipolar disorder; Tobacco use disorder; Smoker; H/O ovarian cystectomy; H/O abnormal pap smears and Anxiety. She presents today for annual physical.   HPI  Overall the patient is doing well though she does have a couple things she would like to discuss today.   First, the patient had a Mirena IUD placed in 2011 and normally does not have a period. However, two weeks ago, the patient experienced beeding with cramping that lasted a couple days.   Additionally, the patient reports her chest pain has improved and a full work up by a cardiologist has not shown any etiology for her pain. Her cardiologist suggested that she be placed on a trial of PPIs to see if that alleviates her chest pain.   The patient also reports that the buldging disk in her neck continues to give her pain. The patient does massage, swimming and stretching which seem to help somewhat. Her pain is being managed by Dr. Junius Roads. She does have a history of previous epidurals which worked for 9 months or longer. She states that her insurance will longer pay for this the  The patient reports that her mood has been stable on her current Lamictel regimen. The patient has also been taking valtrex as needed which prevented a breakout in early March. The patient uses valium once every 2 to 3 weeks.   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 The patient reports that there are some new stresses in the family, but that she and her husband are handling these well.   The patient has recently cut back her smoking to only 8 cigarettes a day. She is very proud of this accomplishment and not particularly interested in cutting back any more at this time.  The patient rarely drinks alcohol.   The patient reports that she tries to eat more healthy and has been dieting with her husband. She is also very active and does stretching class 2-3 times a week and swimming 2-3 times a week.  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 Her paps have been normal for the last 15-20 years or so. She is due for a pap smear today. Otherwise the patient is up to date on her health maintenance.   Review of Systems is negative except per HPI.    Objective:   Physical Exam  Constitutional: Patient is well-developed, well-nourished, and in no distress. HENT: Head is normocephalic and atraumatic.  Mouth/Throat: Oropharynx is clear and moist without erythema or exudates Eyes: Conjunctivae and EOM are normal. Pupils are equal, round, and reactive to light.  Ears: Normal appearance of bilateral TMs Cardiovascular: Normal rate, regular rhythm. Exam reveals no murmurs, gallops and no friction rub.  Pulmonary/Chest: Effort normal and CTAB. No respiratory distress. No wheezes or ronchi.   Abdominal: Soft, non-tender and non-distended. There is no rebound or guarding. No HSM.  Neurological: Patient is alert and oriented to person, place, and time.  Reflex Scores: Biceps and Patellar reflexes were 2+ and equal bilaterally.  Skin: Skin is warm and dry. No rash noted.  Psychiatric: Affect normal.    pelvic exam shows the string to be in place. Slight bleeding was noted. No masses were appreciated. No cervical tenderness to palpation. Assessment & Plan:  Routine general medical examination at a health care facility - Plan: POCT urinalysis dipstick, PAP, Thin Prep w/HPV rflx HPV Type 16/18 ([facility])  Tobacco use disorder  Mood disorder - Plan: LAMICTAL 200 MG tablet, diazepam (VALIUM) 2 MG tablet  Hyperlipidemia LDL goal < 100 -  Plan: simvastatin (ZOCOR) 20 MG tablet  Family history of heart disease in female family member before age 28  Herpes labialis  History of abnormal cervical Pap smear  HNP (herniated nucleus pulposus), cervical - Plan: Ambulatory referral to Orthopedic Surgery  Will have the patient take 2 Prilosec daily for the next several weeks.

## 2014-04-01 NOTE — Patient Instructions (Signed)
Take 2 Prilosec daily for the next several weeks and see what that'll do for the pain

## 2014-07-15 ENCOUNTER — Telehealth: Payer: Self-pay | Admitting: Family Medicine

## 2014-07-15 NOTE — Telephone Encounter (Signed)
Pt called states she wants a mental health out of work note for today and tomorrow.  She states she cannot leave the house and she would not be nice to anyone.  I advised her she would need to be seen.  She wants you to be asked first.  Pt ph [phone]

## 2014-07-15 NOTE — Telephone Encounter (Signed)
Pt said okay and thank you

## 2014-07-15 NOTE — Telephone Encounter (Signed)
I cannot give that note. Have her use her PAL days

## 2014-07-26 ENCOUNTER — Other Ambulatory Visit: Payer: Self-pay | Admitting: Family Medicine

## 2014-08-26 ENCOUNTER — Other Ambulatory Visit: Payer: Self-pay | Admitting: Family Medicine

## 2014-08-26 NOTE — Telephone Encounter (Signed)
IS THIS OKAY 

## 2014-10-02 ENCOUNTER — Other Ambulatory Visit: Payer: Self-pay

## 2014-10-02 ENCOUNTER — Telehealth: Payer: Self-pay | Admitting: Family Medicine

## 2014-10-02 DIAGNOSIS — F39 Unspecified mood [affective] disorder: Secondary | ICD-10-CM

## 2014-10-02 MED ORDER — DIAZEPAM 2 MG PO TABS: ORAL_TABLET | ORAL | Status: DC

## 2014-10-02 NOTE — Telephone Encounter (Signed)
Pt needs refill of valium sent to [facility]. [facility] [address] [address]

## 2014-10-02 NOTE — Telephone Encounter (Signed)
Called med in 

## 2014-10-02 NOTE — Telephone Encounter (Signed)
Okay to renew

## 2014-10-18 ENCOUNTER — Telehealth: Payer: Self-pay | Admitting: Family Medicine

## 2014-10-18 NOTE — Telephone Encounter (Signed)
Pt called and stated that she is having a fever blister out break and her refills have expired. PT HAS A NEW [facility] [place] [address]. Please refill valtrex.

## 2014-10-20 MED ORDER — VALACYCLOVIR HCL 500 MG PO TABS: 500.0000 mg | ORAL_TABLET | ORAL | Status: DC | PRN

## 2015-01-09 ENCOUNTER — Ambulatory Visit (INDEPENDENT_AMBULATORY_CARE_PROVIDER_SITE_OTHER): Payer: 59 | Admitting: Family Medicine

## 2015-01-09 ENCOUNTER — Encounter: Payer: Self-pay | Admitting: Family Medicine

## 2015-01-09 VITALS — BP 120/80 | HR 78 | Ht 64.5 in | Wt 193.0 lb

## 2015-01-09 DIAGNOSIS — Z720 Tobacco use: Secondary | ICD-10-CM

## 2015-01-09 DIAGNOSIS — B001 Herpesviral vesicular dermatitis: Secondary | ICD-10-CM

## 2015-01-09 DIAGNOSIS — F39 Unspecified mood [affective] disorder: Secondary | ICD-10-CM

## 2015-01-09 DIAGNOSIS — F17200 Nicotine dependence, unspecified, uncomplicated: Secondary | ICD-10-CM

## 2015-01-09 DIAGNOSIS — Z0000 Encounter for general adult medical examination without abnormal findings: Secondary | ICD-10-CM

## 2015-01-09 MED ORDER — DIAZEPAM 2 MG PO TABS: ORAL_TABLET | ORAL | Status: DC

## 2015-01-09 MED ORDER — VALACYCLOVIR HCL 1 G PO TABS: ORAL_TABLET | ORAL | Status: DC

## 2015-01-09 NOTE — Patient Instructions (Signed)
Heat to your neck for 20 minutes then stretching. Use diclofenac regularly and continue on Flonase. If you have continued difficulty come back Call [phone] quit now. Look at the psychological part smoking and the key will give yourself something else TO DO

## 2015-01-09 NOTE — Progress Notes (Signed)
   Subjective:    Patient ID: Stacy Mckinney, female    DOB: 11-May-1971, 44 y.o.   MRN: [id number]  HPI She is here for an annual exam. She complains of headache in the frontal sinus area and occipital for the past month described as constant, worse in the morning, ad relieved with massage. Headaches are not waking her up at night. She denies fever, visual disturbances or dizziness. She has been taking diclofenac for her chronic neck aches. She has been smoking cigarettes 1ppd and states she is ready to stop smoking. Family history, social history, and health maintenance reviewed. She states the gynecologist she has seen in the past retired and would like a referral to one near her home. She has an IUD that was placed in 2011 and would like to have it replaced.  She reports her mood is "normal" and stable on Lamictal. She takes diazepam 1-2 times per month, mainly when she is "stuck in an airport". She lives with her husband and travels often for work.  Social and family history were reviewed as well as immunizations and health maintenance.  Review of Systems  All other systems reviewed and are negative.      Objective:   Physical Exam She is alert, well nourished and in no distress. Tympanic membranes and canals are normal. Frontal and maxillary sinuses non tender. Throat is clear. Nasal mucosa pink and moist. Tonsils are normal. Neck is supple without adenopathy or thyromegaly. Cardiac exam shows a regular sinus rhythm without murmurs or gallops. Lungs are clear to auscultation. Abdomen soft, non distended, non tender and normal bowel sounds without hepatosplenomegaly. Peripheral pulses 2+ and no peripheral edema. Breast and pelvic exam deferred.         Assessment & Plan:   Routine general medical examination at a health care facility  Herpes labialis - Plan: valACYclovir (VALTREX) 1000 MG tablet  Mood disorder - Plan: diazepam (VALIUM) 2 MG tablet  Tobacco use disorder  Heat to your  neck for 20 minutes then stretching. Use diclofenac regularly and continue on Flonase. If you have continued difficulty come back.  Call [phone] quit now. Look at the psychological part smoking and the key will give yourself something else TO DO

## 2015-03-15 ENCOUNTER — Other Ambulatory Visit: Payer: Self-pay | Admitting: Family Medicine

## 2015-03-17 NOTE — Telephone Encounter (Signed)
Is this okay?

## 2015-03-17 NOTE — Telephone Encounter (Signed)
Called in med 

## 2015-03-17 NOTE — Telephone Encounter (Signed)
Okay to renew

## 2015-04-04 ENCOUNTER — Other Ambulatory Visit: Payer: Self-pay | Admitting: Family Medicine

## 2015-04-04 NOTE — Telephone Encounter (Signed)
Is this okay to refill? 

## 2015-05-23 ENCOUNTER — Ambulatory Visit (INDEPENDENT_AMBULATORY_CARE_PROVIDER_SITE_OTHER): Payer: 59 | Admitting: Family Medicine

## 2015-05-23 ENCOUNTER — Encounter: Payer: Self-pay | Admitting: Family Medicine

## 2015-05-23 VITALS — BP 120/80 | HR 88 | Wt 203.0 lb

## 2015-05-23 DIAGNOSIS — L739 Follicular disorder, unspecified: Secondary | ICD-10-CM

## 2015-05-23 NOTE — Patient Instructions (Signed)
Use either Dial soap or Lever 2000. You can also once or twice a week use Hibiclens. The next time you get a little pimple come in for culture

## 2015-05-23 NOTE — Progress Notes (Signed)
   Subjective:    Patient ID: Stacy Mckinney, female    DOB: Oct 10, 1971, 44 y.o.   MRN: [id number]  HPI He is here for evaluation a rash. The rash tends to come and go. Presently she is having one in her bra line especially on the left. She also has some lesions on her leg. Review come to ahead and have a small amount of drainage. She's not ever seen vesicles.   Review of Systems     Objective:   Physical Exam Scattered erythematous drying lesions noted tube which are in the bra line on the left. She does have one on her buttock on the left as well as on the leg.       Assessment & Plan:  Folliculitis I explained that these are probably bacterial and not viral. It does not sound like there is sella or necessarily HSV. Recommend coming in when she has a lesion so this can be cultured. Also recommended using either Dial soap or Lever 2000 as well as possibly using Hibiclens.

## 2015-10-01 ENCOUNTER — Other Ambulatory Visit: Payer: Self-pay | Admitting: Family Medicine

## 2015-10-01 NOTE — Telephone Encounter (Signed)
Is this okay?

## 2015-11-12 ENCOUNTER — Other Ambulatory Visit: Payer: Self-pay | Admitting: Family Medicine

## 2015-11-12 NOTE — Telephone Encounter (Signed)
Is this okay?

## 2015-11-13 NOTE — Telephone Encounter (Signed)
Okay to renew

## 2016-02-17 ENCOUNTER — Telehealth: Payer: Self-pay | Admitting: Family Medicine

## 2016-02-17 NOTE — Telephone Encounter (Signed)
It's time for a follow-up appointment

## 2016-02-17 NOTE — Telephone Encounter (Signed)
Left message on machine for patient to call back and schedule a follow up appointment

## 2016-02-17 NOTE — Telephone Encounter (Signed)
Pt states we will be receiving a refill request for Lamictal 200 MG to her [facility] at [place]. She wants it noted that this must be sent in as a brand name in order to be covered.

## 2016-02-23 ENCOUNTER — Encounter: Payer: Self-pay | Admitting: Family Medicine

## 2016-02-23 ENCOUNTER — Ambulatory Visit (INDEPENDENT_AMBULATORY_CARE_PROVIDER_SITE_OTHER): Payer: 59 | Admitting: Family Medicine

## 2016-02-23 VITALS — BP 128/90 | HR 97 | Ht 64.5 in | Wt 199.6 lb

## 2016-02-23 DIAGNOSIS — T887XXA Unspecified adverse effect of drug or medicament, initial encounter: Secondary | ICD-10-CM

## 2016-02-23 DIAGNOSIS — T50905A Adverse effect of unspecified drugs, medicaments and biological substances, initial encounter: Secondary | ICD-10-CM

## 2016-02-23 NOTE — Progress Notes (Signed)
   Subjective:    Patient ID: Stacy Mckinney, female    DOB: 03-24-1971, 45 y.o.   MRN: [id number]  HPI She using Tessalon Perles to help with the cough and noted mood swings, irritability and excessive crying. She then read about Tessalon and found out it could potentially be the cause. She has since stopped and her mood has returned to normal. Continues on her Lamoictal.   Review of Systems     Objective:   Physical Exam Report ended in no distress with appropriate affect       Assessment & Plan:  Adverse drug reaction, initial encounter I will document this appropriately in her record.

## 2016-04-20 ENCOUNTER — Ambulatory Visit (INDEPENDENT_AMBULATORY_CARE_PROVIDER_SITE_OTHER): Payer: 59

## 2016-04-20 ENCOUNTER — Ambulatory Visit
Admission: EM | Admit: 2016-04-20 | Discharge: 2016-04-20 | Disposition: A | Discharge status: Home / Self Care | Payer: 59 | Attending: Family Medicine | Admitting: Family Medicine

## 2016-04-20 DIAGNOSIS — S63501A Unspecified sprain of right wrist, initial encounter: Secondary | ICD-10-CM

## 2016-04-20 NOTE — Discharge Instructions (Signed)
Rest wrist. Apply ice and elevate. Avoid strenuous activity. Wear splint as long as pain continues.   Follow up with your primary care physician this week as needed. Return to Urgent care for new or worsening concerns.    Wrist Sprain With Rehab A sprain is an injury in which a ligament that maintains the proper alignment of a joint is partially or completely torn. The ligaments of the wrist are susceptible to sprains. Sprains are classified into three categories. Grade 1 sprains cause pain, but the tendon is not lengthened. Grade 2 sprains include a lengthened ligament because the ligament is stretched or partially ruptured. With grade 2 sprains there is still function, although the function may be diminished. Grade 3 sprains are characterized by a complete tear of the tendon or muscle, and function is usually impaired. SYMPTOMS   Pain tenderness, inflammation, and/or bruising (contusion) of the injury.  A "pop" or tear felt and/or heard at the time of injury.  Decreased wrist function. CAUSES  A wrist sprain occurs when a force is placed on one or more ligaments that is greater than it/they can withstand. Common mechanisms of injury include:  Catching a ball with your hands.  Repetitive and/ or strenuous extension or flexion of the wrist. RISK INCREASES WITH:  Previous wrist injury.  Contact sports (boxing or wrestling).  Activities in which falling is common.  Poor strength and flexibility.  Improperly fitted or padded protective equipment. PREVENTION  Warm up and stretch properly before activity.  Allow for adequate recovery between workouts.  Maintain physical fitness:  Strength, flexibility, and endurance.  Cardiovascular fitness.  Protect the wrist joint by limiting its motion with the use of taping, braces, or splints.  Protect the wrist after injury for 6 to 12 months. PROGNOSIS  The prognosis for wrist sprains depends on the degree of injury. Grade 1 sprains  require 2 to 6 weeks of treatment. Grade 2 sprains require 6 to 8 weeks of treatment, and grade 3 sprains require up to 12 weeks.  RELATED COMPLICATIONS   Prolonged healing time, if improperly treated or re-injured.  Recurrent symptoms that result in a chronic problem.  Injury to nearby structures (bone, cartilage, nerves, or tendons).  Arthritis of the wrist.  Inability to compete in athletics at a high level.  Wrist stiffness or weakness.  Progression to a complete rupture of the ligament. TREATMENT  Treatment initially involves resting from any activities that aggravate the symptoms, and the use of ice and medications to help reduce pain and inflammation. Your caregiver may recommend immobilizing the wrist for a period of time in order to reduce stress on the ligament and allow for healing. After immobilization it is important to perform strengthening and stretching exercises to help regain strength and a full range of motion. These exercises may be completed at home or with a therapist. Surgery is not usually required for wrist sprains, unless the ligament has been ruptured (grade 3 sprain). MEDICATION   If pain medication is necessary, then nonsteroidal anti-inflammatory medications, such as aspirin and ibuprofen, or other minor pain relievers, such as acetaminophen, are often recommended.  Do not take pain medication for 7 days before surgery.  Prescription pain relievers may be given if deemed necessary by your caregiver. Use only as directed and only as much as you need. HEAT AND COLD  Cold treatment (icing) relieves pain and reduces inflammation. Cold treatment should be applied for 10 to 15 minutes every 2 to 3 hours for inflammation and  pain and immediately after any activity that aggravates your symptoms. Use ice packs or massage the area with a piece of ice (ice massage).  Heat treatment may be used prior to performing the stretching and strengthening activities prescribed  by your caregiver, physical therapist, or athletic trainer. Use a heat pack or soak your injury in warm water. SEEK MEDICAL CARE IF:  Treatment seems to offer no benefit, or the condition worsens.  Any medications produce adverse side effects. EXERCISES RANGE OF MOTION (ROM) AND STRETCHING EXERCISES - Wrist Sprain  These exercises may help you when beginning to rehabilitate your injury. Your symptoms may resolve with or without further involvement from your physician, physical therapist or athletic trainer. While completing these exercises, remember:   Restoring tissue flexibility helps normal motion to return to the joints. This allows healthier, less painful movement and activity.  An effective stretch should be held for at least 30 seconds.  A stretch should never be painful. You should only feel a gentle lengthening or release in the stretched tissue. RANGE OF MOTION - Wrist Flexion, Active-Assisted  Extend your right / left elbow with your fingers pointing down.*  Gently pull the back of your hand towards you until you feel a gentle stretch on the top of your forearm.  Hold this position for __________ seconds. Repeat __________ times. Complete this exercise __________ times per day.  *If directed by your physician, physical therapist or athletic trainer, complete this stretch with your elbow bent rather than extended. RANGE OF MOTION - Wrist Extension, Active-Assisted  Extend your right / left elbow and turn your palm upwards.*  Gently pull your palm/fingertips back so your wrist extends and your fingers point more toward the ground.  You should feel a gentle stretch on the inside of your forearm.  Hold this position for __________ seconds. Repeat __________ times. Complete this exercise __________ times per day. *If directed by your physician, physical therapist or athletic trainer, complete this stretch with your elbow bent, rather than extended. RANGE OF MOTION -  Supination, Active  Stand or sit with your elbows at your side. Bend your right / left elbow to 90 degrees.  Turn your palm upward until you feel a gentle stretch on the inside of your forearm.  Hold this position for __________ seconds. Slowly release and return to the starting position. Repeat __________ times. Complete this stretch __________ times per day.  RANGE OF MOTION - Pronation, Active  Stand or sit with your elbows at your side. Bend your right / left elbow to 90 degrees.  Turn your palm downward until you feel a gentle stretch on the top of your forearm.  Hold this position for __________ seconds. Slowly release and return to the starting position. Repeat __________ times. Complete this stretch __________ times per day.  STRETCH - Wrist Flexion  Place the back of your right / left hand on a tabletop leaving your elbow slightly bent. Your fingers should point away from your body.  Gently press the back of your hand down onto the table by straightening your elbow. You should feel a stretch on the top of your forearm.  Hold this position for __________ seconds. Repeat __________ times. Complete this stretch __________ times per day.  STRETCH - Wrist Extension  Place your right / left fingertips on a tabletop leaving your elbow slightly bent. Your fingers should point backwards.  Gently press your fingers and palm down onto the table by straightening your elbow. You should feel a  stretch on the inside of your forearm.  Hold this position for __________ seconds. Repeat __________ times. Complete this stretch __________ times per day.  STRENGTHENING EXERCISES - Wrist Sprain These exercises may help you when beginning to rehabilitate your injury. They may resolve your symptoms with or without further involvement from your physician, physical therapist or athletic trainer. While completing these exercises, remember:   Muscles can gain both the endurance and the strength needed  for everyday activities through controlled exercises.  Complete these exercises as instructed by your physician, physical therapist or athletic trainer. Progress with the resistance and repetition exercises only as your caregiver advises. STRENGTH - Wrist Flexors  Sit with your right / left forearm palm-up and fully supported. Your elbow should be resting below the height of your shoulder. Allow your wrist to extend over the edge of the surface.  Loosely holding a __________ weight or a piece of rubber exercise band/tubing, slowly curl your hand up toward your forearm.  Hold this position for __________ seconds. Slowly lower the wrist back to the starting position in a controlled manner. Repeat __________ times. Complete this exercise __________ times per day.  STRENGTH - Wrist Extensors  Sit with your right / left forearm palm-down and fully supported. Your elbow should be resting below the height of your shoulder. Allow your wrist to extend over the edge of the surface.  Loosely holding a __________ weight or a piece of rubber exercise band/tubing, slowly curl your hand up toward your forearm.  Hold this position for __________ seconds. Slowly lower the wrist back to the starting position in a controlled manner. Repeat __________ times. Complete this exercise __________ times per day.  STRENGTH - Ulnar Deviators  Stand with a ____________________ weight in your right / left hand, or sit holding on to the rubber exercise band/tubing with your opposite arm supported.  Move your wrist so that your pinkie travels toward your forearm and your thumb moves away from your forearm.  Hold this position for __________ seconds and then slowly lower the wrist back to the starting position. Repeat __________ times. Complete this exercise __________ times per day STRENGTH - Radial Deviators  Stand with a ____________________ weight in your  right / left hand, or sit holding on to the rubber  exercise band/tubing with your arm supported.  Raise your hand upward in front of you or pull up on the rubber tubing.  Hold this position for __________ seconds and then slowly lower the wrist back to the starting position. Repeat __________ times. Complete this exercise __________ times per day. STRENGTH - Forearm Supinators  Sit with your right / left forearm supported on a table, keeping your elbow below shoulder height. Rest your hand over the edge, palm down.  Gently grip a hammer or a soup ladle.  Without moving your elbow, slowly turn your palm and hand upward to a "thumbs-up" position.  Hold this position for __________ seconds. Slowly return to the starting position. Repeat __________ times. Complete this exercise __________ times per day.  STRENGTH - Forearm Pronators  Sit with your right / left forearm supported on a table, keeping your elbow below shoulder height. Rest your hand over the edge, palm up.  Gently grip a hammer or a soup ladle.  Without moving your elbow, slowly turn your palm and hand upward to a "thumbs-up" position.  Hold this position for __________ seconds. Slowly return to the starting position. Repeat __________ times. Complete this exercise __________ times per day.  STRENGTH - Grip  Grasp a tennis ball, a dense sponge, or a large, rolled sock in your hand.  Squeeze as hard as you can without increasing any pain.  Hold this position for __________ seconds. Release your grip slowly. Repeat __________ times. Complete this exercise __________ times per day.    This information is not intended to replace advice given to you by your health care provider. Make sure you discuss any questions you have with your health care provider.   Document Released: 11/15/2005 Document Revised: 08/06/2015 Document Reviewed: 02/27/2009 Elsevier Interactive Patient Education [employer].

## 2016-04-20 NOTE — ED Provider Notes (Signed)
[facility]  ____________________________________________  Time seen: Approximately 5:02 PM  I have reviewed the triage vital signs and the nursing notes.  HISTORY  Chief Complaint Wrist Pain   HPI Stacy Mckinney is a 45 y.o. female presents for the complaint of right wrist pain. Patient reports that early this morning she was going down her steps and tripped over her dog. Patient reports that she started to fall backwards and used her right hand to brace herself instead of falling. Patient states that she was able to catch herself with her right hand however this caused right wrist pain. Patient states that she did not fully fall. Denies head injury or loss of consciousness. Denies other pain or injuries.   Patient reports that she is right-hand dominant. Reports right wrist pain since injury. Reports continued to work today. States pain is mostly with movement and rotation and described as mild to moderate. Denies pain at rest. Denies numbness or tingling sensation. States able to still perform full range of motion but with some pain.  Denies head injury, loss of consciousness, neck pain, back pain, weakness, dizziness, chest pain, shortness of breath, other pain, other extremity injury. Reports only started to fall because she tripped over the dog.  No LMP recorded. Patient is not currently having periods (Reason: IUD). Denies pregnancy.     Past Medical History  Diagnosis Date  . Bipolar disorder ([place])   . Tobacco use disorder   . FHx: cardiovascular disease   . Smoker   . H/O ovarian cystectomy   . Anxiety   . Hyperlipidemia   . Nausea   . Abdominal pain     Patient Active Problem List   Diagnosis Date Noted  . History of abnormal cervical Pap smear 04/01/2014  . Tobacco use disorder 01/25/2014  . Herpes labialis 08/04/2012  . Mood disorder ([place]) 08/04/2012  . Hyperlipidemia LDL goal < 100 10/07/2011  . Family history of heart disease in female family member before  age 62 10/07/2011    Past Surgical History  Procedure Laterality Date  . [place]    . Laparoscopic ovarian cystectomy    . Elbow surgery      left elbow  . Rhinoplasty  age 78    due to broken nose  . Tonsillectomy      age 84  . Laparoscopic appendectomy  10/23/2012    Procedure: APPENDECTOMY LAPAROSCOPIC;  Surgeon: Earnstine Regal, MD;  Location: [facility];  Service: General;  Laterality: N/A;    Current Outpatient Rx  Name  Route  Sig  Dispense  Refill  . Coenzyme Q10 (COQ-10) 400 MG CAPS   Oral   Take by mouth.         . diazepam (VALIUM) 2 MG tablet      TAKE 1 TABLET BY MOUTH EVERY 6 HOURS AS NEEDED ANXIETY   12 tablet   1     This request is for a new prescription for a contr ...   . LAMICTAL 200 MG tablet      Take 1 tablet by mouth  daily Patient taking differently: Take 1 tablet by mouth  daily, brand medically necessary   90 tablet   3     Dispense as written.   . valACYclovir (VALTREX) 1000 MG tablet      2 pills twice a day for one day   20 tablet   1   .  Allergies Other; Nitrous oxide; and Tessalon perles  Family History  Problem Relation Age of Onset  . Heart disease Father   . Hypertension Father   . Stroke Maternal Grandmother   . Cancer Maternal Grandmother     ovarian  . Stroke Maternal Grandfather   . Stroke Paternal Grandmother   . Depression Paternal Grandmother   . Cancer Paternal Grandmother     breast, ovarian  . Stroke Paternal Grandfather   . Cancer Maternal Aunt     ovarian    Social History Social History  Substance Use Topics  . Smoking status: Current Every Day Smoker -- 1.00 packs/day for 25 years    Types: Cigarettes  . Smokeless tobacco: None  . Alcohol Use: Yes     Comment: 3-4 drinks a month    Review of Systems Constitutional: No fever/chills Eyes: No visual changes. ENT: No sore throat. Cardiovascular: Denies chest pain. Respiratory: Denies shortness of breath. Gastrointestinal: No abdominal  pain.  No nausea, no vomiting.  No diarrhea.  No constipation. Genitourinary: Negative for dysuria. Musculoskeletal: Negative for back pain.Positive right wrist pain. Skin: Negative for rash. Neurological: Negative for headaches, focal weakness or numbness.  10-point ROS otherwise negative.  ____________________________________________   PHYSICAL EXAM:  VITAL SIGNS: ED Triage Vitals  Enc Vitals Group     BP 04/20/16 1614 133/90 mmHg     Pulse Rate 04/20/16 1614 82     Resp 04/20/16 1614 20     Temp 04/20/16 1614 97.9 F (36.6 C)     Temp Source 04/20/16 1614 Oral     SpO2 04/20/16 1614 100 %     Weight 04/20/16 1614 197 lb (89.359 kg)     Height 04/20/16 1614 5\' 4"  (1.626 m)     Head Cir --      Peak Flow --      Pain Score 04/20/16 1617 5     Pain Loc --      Pain Edu? --      Excl. in [place]? --     Constitutional: Alert and oriented. Well appearing and in no acute distress. Eyes: Conjunctivae are normal. PERRL. EOMI. Head: Atraumatic. Neck: No stridor.  No cervical spine tenderness to palpation. Cardiovascular: Normal rate, regular rhythm. Grossly normal heart sounds.  Good peripheral circulation. Respiratory: Normal respiratory effort.  No retractions. Lungs CTAB. Gastrointestinal: Soft and nontender. Musculoskeletal: No lower or upper extremity tenderness nor edema.  No cervical, thoracic or lumbar tenderness to palpation. Except: Right distal lateral wrist mild to moderate tenderness to palpation, minimal swelling, no ecchymosis, mild pain with right wrist rotation but full range of motion present, mild pain with resisted right wrist flexion and extension, bilateral hand grips strong and equal, Bilateral distal radial pulses equal, capillary refill less than 2 seconds to all distal right hand fingers, no motor or tendon deficit to right upper extremity, right upper extremity otherwise nontender. Neurologic:  Normal speech and language. No gross focal neurologic deficits  are appreciated. No gait instability. Skin:  Skin is warm, dry and intact. No rash noted. Psychiatric: Mood and affect are normal. Speech and behavior are normal.  ____________________________________________   LABS (all labs ordered are listed, but only abnormal results are displayed)  Labs Reviewed - No data to display  RADIOLOGY  Dg Wrist Complete Right  04/20/2016  CLINICAL DATA:  Fall, landing on right wrist and hand a couple of days ago. Pain and swelling. Initial encounter. EXAM: RIGHT WRIST - COMPLETE 3+ VIEW  COMPARISON:  None. FINDINGS: There is mild soft tissue swelling about the wrist. No fracture dislocation is identified. Joint space widths are preserved. Bone mineralization appears normal. No radiopaque foreign body. IMPRESSION: Soft tissue swelling without acute osseous abnormality identified. Electronically Signed   By: Logan Bores M.D.   On: 04/20/2016 16:39   ____________________________________________   PROCEDURES  Procedure(s) performed:  Velcro cock up splint applied by RN. Neurovascular intact post application.  ______________________________________   INITIAL IMPRESSION / ASSESSMENT AND PLAN / ED COURSE  Pertinent labs & imaging results that were available during my care of the patient were reviewed by me and considered in my medical decision making (see chart for details).  Very well-appearing patient. No acute distress. Presents with complaints of right wrist pain post mechanical injury. Denies any other pain or injury. Will evaluate right wrist x-ray.  Per radiologist right wrist soft tissue swelling without acute osseous abnormality identified. Will treat with support. Cock up Velcro splint applied and directed to use as needed as long as pain continues.. When necessary over-the-counter Tylenol or ibuprofen as needed. Patient denies need for prescription medications. Encouraged patient to apply ice and elevate. Follow-up with PCP or orthopedics as needed  for continued pain.  Discussed follow up with Primary care physician this week. Discussed follow up and return parameters including no resolution or any worsening concerns. Patient verbalized understanding and agreed to plan.   ____________________________________________   FINAL CLINICAL IMPRESSION(S) / ED DIAGNOSES  Final diagnoses:  Right wrist sprain, initial encounter     Discharge Medication List as of 04/20/2016  5:00 PM      Note: This dictation was prepared with Dragon dictation along with smaller phrase technology. Any transcriptional errors that result from this process are unintentional.       Marylene Land, NP 04/20/16 2028

## 2016-05-23 HISTORY — PX: ABDOMINAL HYSTERECTOMY: SHX81

## 2016-07-21 ENCOUNTER — Other Ambulatory Visit: Payer: Self-pay | Admitting: Family Medicine

## 2016-07-21 DIAGNOSIS — B001 Herpesviral vesicular dermatitis: Secondary | ICD-10-CM

## 2016-07-22 NOTE — Telephone Encounter (Signed)
Is this okay to refill? 

## 2016-07-23 ENCOUNTER — Telehealth: Payer: Self-pay | Admitting: Family Medicine

## 2016-07-23 NOTE — Telephone Encounter (Signed)
Called in Diazepam to [facility] per Dr. Redmond School

## 2016-07-26 NOTE — Telephone Encounter (Signed)
CALLED IN VALIUM PER JCL

## 2016-10-20 ENCOUNTER — Other Ambulatory Visit: Payer: Self-pay | Admitting: Family Medicine

## 2016-10-20 NOTE — Telephone Encounter (Signed)
Is this okay to refill? 

## 2016-10-20 NOTE — Telephone Encounter (Signed)
Renew this but have her set up an appointment.

## 2016-11-16 ENCOUNTER — Encounter: Payer: Self-pay | Admitting: Family Medicine

## 2016-11-16 ENCOUNTER — Ambulatory Visit (INDEPENDENT_AMBULATORY_CARE_PROVIDER_SITE_OTHER): Payer: 59 | Admitting: Family Medicine

## 2016-11-16 VITALS — BP 118/80 | HR 76 | Ht 64.0 in | Wt 203.0 lb

## 2016-11-16 DIAGNOSIS — Z87898 Personal history of other specified conditions: Secondary | ICD-10-CM

## 2016-11-16 DIAGNOSIS — Z8742 Personal history of other diseases of the female genital tract: Secondary | ICD-10-CM

## 2016-11-16 DIAGNOSIS — Z6834 Body mass index (BMI) 34.0-34.9, adult: Secondary | ICD-10-CM

## 2016-11-16 DIAGNOSIS — L600 Ingrowing nail: Secondary | ICD-10-CM

## 2016-11-16 DIAGNOSIS — F17200 Nicotine dependence, unspecified, uncomplicated: Secondary | ICD-10-CM | POA: Diagnosis not present

## 2016-11-16 DIAGNOSIS — Z8249 Family history of ischemic heart disease and other diseases of the circulatory system: Secondary | ICD-10-CM

## 2016-11-16 DIAGNOSIS — E6609 Other obesity due to excess calories: Secondary | ICD-10-CM

## 2016-11-16 DIAGNOSIS — E785 Hyperlipidemia, unspecified: Secondary | ICD-10-CM

## 2016-11-16 DIAGNOSIS — F39 Unspecified mood [affective] disorder: Secondary | ICD-10-CM

## 2016-11-16 DIAGNOSIS — Z0000 Encounter for general adult medical examination without abnormal findings: Secondary | ICD-10-CM | POA: Diagnosis not present

## 2016-11-16 DIAGNOSIS — B001 Herpesviral vesicular dermatitis: Secondary | ICD-10-CM

## 2016-11-16 DIAGNOSIS — E66811 Other obesity due to excess calories: Secondary | ICD-10-CM

## 2016-11-16 DIAGNOSIS — Z1239 Encounter for other screening for malignant neoplasm of breast: Secondary | ICD-10-CM

## 2016-11-16 LAB — CBC WITH DIFFERENTIAL/PLATELET
BASOS PCT: 1 %
Basophils Absolute: 86 cells/uL (ref 0–200)
EOS ABS: 172 {cells}/uL (ref 15–500)
Eosinophils Relative: 2 %
HCT: 42.8 % (ref 35.0–45.0)
Hemoglobin: 14 g/dL (ref 11.7–15.5)
LYMPHS PCT: 33 %
Lymphs Abs: 2838 cells/uL (ref 850–3900)
MCH: 29 pg (ref 27.0–33.0)
MCHC: 32.7 g/dL (ref 32.0–36.0)
MCV: 88.6 fL (ref 80.0–100.0)
MONOS PCT: 7 %
MPV: 11.2 fL (ref 7.5–12.5)
Monocytes Absolute: 602 cells/uL (ref 200–950)
Neutro Abs: 4902 cells/uL (ref 1500–7800)
Neutrophils Relative %: 57 %
PLATELETS: 259 10*3/uL (ref 140–400)
RBC: 4.83 MIL/uL (ref 3.80–5.10)
RDW: 13.3 % (ref 11.0–15.0)
WBC: 8.6 10*3/uL (ref 4.0–10.5)

## 2016-11-16 NOTE — Progress Notes (Signed)
Subjective:    Patient ID: Stacy Mckinney, female    DOB: March 21, 1971, 45 y.o.   MRN: [id number]  HPI She is here for complete examination. She is doing well on her limit total. She also occasionally uses Valium but usually 1 or 2 per month. She does have difficulty occasionally with herpes labialis and uses Valtrex appropriately. She does continue to smoke and is not interested in quitting. Her main complaint today is an ingrown toe on the left she has had difficulty with for the last several months. She states that this keeps her from exercising. She has a previous history of abnormal Pap but is being followed up probably by GYN. There is also family history of heart disease. She has gained weight and is starting back on the [place] weight loss program. She has a goal dress size of 12 set up. He recently had a job change and this is going quite well. She has no other concerns or complaints.   Review of Systems  All other systems reviewed and are negative.      Objective:   Physical Exam BP 118/80   Pulse 76   Ht 5\' 4"  (1.626 m)   Wt 203 lb (92.1 kg)   BMI 34.84 kg/m   General Appearance:    Alert, cooperative, no distress, appears stated age  Head:    Normocephalic, without obvious abnormality, atraumatic  Eyes:    PERRL, conjunctiva/corneas clear, EOM's intact, fundi    benign  Ears:    Normal TM's and external ear canals  Nose:   Nares normal, mucosa normal, no drainage or sinus   tenderness  Throat:   Lips, mucosa, and tongue normal; teeth and gums normal  Neck:   Supple, no lymphadenopathy;  thyroid:  no   enlargement/tenderness/nodules; no carotid   bruit or JVD     Lungs:     Clear to auscultation bilaterally without wheezes, rales or     ronchi; respirations unlabored      Heart:    Regular rate and rhythm, S1 and S2 normal, no murmur, rub   or gallop  Breast Exam:    Deferred to GYN  Abdomen:     Soft, non-tender, nondistended, normoactive bowel sounds,    no masses, no  hepatosplenomegaly  Genitalia:    Deferred to GYN     Extremities:   No clubbing, cyanosis or edema. Questionable ingrown nail noted on the left great toe.   Pulses:   2+ and symmetric all extremities  Skin:   Skin color, texture, turgor normal, no rashes or lesions  Lymph nodes:   Cervical, supraclavicular, and axillary nodes normal  Neurologic:   CNII-XII intact, normal strength, sensation and gait; reflexes 2+ and symmetric throughout          Psych:   Normal mood, affect, hygiene and grooming.          Assessment & Plan:  Routine general medical examination at a health care facility - Plan: CBC with Differential/Platelet, Comprehensive metabolic panel, Lipid panel  Ingrown toenail without infection - Plan: Ambulatory referral to Podiatry  Herpes labialis  Tobacco use disorder  Mood disorder (HCC)  Hyperlipidemia with target low density lipoprotein (LDL) cholesterol less than 100 mg/dL - Plan: Lipid panel  History of abnormal cervical Pap smear  Family history of heart disease in female family member before age 39 - Plan: Lipid panel  Screening for breast cancer - Plan: MM DIGITAL SCREENING BILATERAL  Class 1 obesity due  to excess calories without serious comorbidity with body mass index (BMI) of 34.0 to 34.9 in adult She will continue on her present medication regimen. I will refer to podiatry. Encouraged her to continue with her weight loss program and suggested even lower dress size than 12. Renew her medications when they come up for renewal. Also recommended she get the 3-D mammogram.

## 2016-11-17 LAB — COMPREHENSIVE METABOLIC PANEL
ALK PHOS: 47 U/L (ref 33–115)
ALT: 11 U/L (ref 6–29)
AST: 14 U/L (ref 10–35)
Albumin: 4.3 g/dL (ref 3.6–5.1)
BUN: 12 mg/dL (ref 7–25)
CHLORIDE: 104 mmol/L (ref 98–110)
CO2: 25 mmol/L (ref 20–31)
Calcium: 9.4 mg/dL (ref 8.6–10.2)
Creat: 0.94 mg/dL (ref 0.50–1.10)
GLUCOSE: 87 mg/dL (ref 65–99)
POTASSIUM: 4 mmol/L (ref 3.5–5.3)
Sodium: 137 mmol/L (ref 135–146)
Total Bilirubin: 0.2 mg/dL (ref 0.2–1.2)
Total Protein: 6.8 g/dL (ref 6.1–8.1)

## 2016-11-17 LAB — LIPID PANEL
CHOL/HDL RATIO: 8.3 ratio — AB (ref <5.0)
Cholesterol: 240 mg/dL — ABNORMAL HIGH (ref <200)
HDL: 29 mg/dL — ABNORMAL LOW (ref >50)
LDL CALC: 134 mg/dL — AB (ref <100)
Triglycerides: 385 mg/dL — ABNORMAL HIGH (ref <150)
VLDL: 77 mg/dL — AB (ref <30)

## 2016-11-18 ENCOUNTER — Encounter: Payer: Self-pay | Admitting: Podiatry

## 2016-11-18 ENCOUNTER — Ambulatory Visit (INDEPENDENT_AMBULATORY_CARE_PROVIDER_SITE_OTHER): Payer: 59 | Admitting: Podiatry

## 2016-11-18 VITALS — BP 106/76 | HR 95 | Resp 16

## 2016-11-18 DIAGNOSIS — L600 Ingrowing nail: Secondary | ICD-10-CM

## 2016-11-18 DIAGNOSIS — M722 Plantar fascial fibromatosis: Secondary | ICD-10-CM

## 2016-11-18 MED ORDER — MELOXICAM 15 MG PO TABS: 15.0000 mg | ORAL_TABLET | Freq: Every day | ORAL | 3 refills | Status: DC

## 2016-11-18 MED ORDER — NEOMYCIN-POLYMYXIN-HC 1 % OT SOLN: OTIC | 1 refills | Status: DC

## 2016-11-18 NOTE — Patient Instructions (Signed)

## 2016-11-18 NOTE — Progress Notes (Signed)
   Subjective:    Patient ID: Stacy Mckinney, female    DOB: 24-Jan-1971, 45 y.o.   MRN: [id number]  HPI: She presents today as an inpatient with a chief complaint of ingrown toenails to the tibia and fibula borders of the hallux bilateral. States they've been this way for many years and she would have to do them out herself. She also complaining of pain to the right foot along the medial plantar fascia area. She states that she has some similar soreness in her left foot but not as severe. She has tried different shoes with this plantar foot pain and night splints.      Review of Systems  Endocrine: Positive for cold intolerance.  All other systems reviewed and are negative.      Objective:   Physical Exam: Vital signs are stable alert and oriented 3. Pulses are palpable. Neurologic system is intact. Deep tendon reflexes are intact. Muscle strength was 5 over 5 dorsiflexion plantar flexors and inverters everters all of his musculature is intact. Orthopedic evaluation demonstrates pain on palpation medial calcaneal tubercles bilateral. Sharp radial nail margin on the tibia and fibular border with erythema hallux bilaterally.        Assessment & Plan:  Ingrown toenails border hallux bilateral. Paronychia present. Plantar fasciitis right greater than left.  Plan: Encouraged her to continue to wear appropriate shoe gear we discussed proper shoe gear stretching her sizes ice therapy and shoe modifications. I also referred for meloxicam. We perform chemical matrixectomy today which she tolerated well after local anesthesia. She was provided with both oral and written home going instructions for care and soaking of the toes. She is also provided a prescription for Cortisporin Otic to be applied twice daily after soaking. I will follow-up with her in 1-2 weeks in which time we will reassess the plantar fasciitis and possible injections if necessary.

## 2016-11-29 ENCOUNTER — Ambulatory Visit
Admission: EM | Admit: 2016-11-29 | Discharge: 2016-11-29 | Disposition: A | Discharge status: Home / Self Care | Payer: 59 | Attending: Emergency Medicine | Admitting: Emergency Medicine

## 2016-11-29 ENCOUNTER — Other Ambulatory Visit: Payer: Self-pay | Admitting: Family Medicine

## 2016-11-29 DIAGNOSIS — M545 Low back pain, unspecified: Secondary | ICD-10-CM

## 2016-11-29 MED ORDER — CYCLOBENZAPRINE HCL 10 MG PO TABS: 10.0000 mg | ORAL_TABLET | Freq: Three times a day (TID) | ORAL | 0 refills | Status: AC | PRN

## 2016-11-29 MED ORDER — TRAMADOL HCL 50 MG PO TABS: 50.0000 mg | ORAL_TABLET | Freq: Three times a day (TID) | ORAL | 0 refills | Status: DC | PRN

## 2016-11-29 MED ORDER — KETOROLAC TROMETHAMINE 60 MG/2ML IM SOLN
60.0000 mg | Freq: Once | INTRAMUSCULAR | Status: AC
  Administered 2016-11-29: 60 mg via INTRAMUSCULAR

## 2016-11-29 NOTE — ED Triage Notes (Signed)
Patient complains of low back pain. Patient states that she reached wrong last night and started having low back pain. Patient states that she cannot get comfortable due to the spasms. Patient states that this started around 6pm last night.

## 2016-11-29 NOTE — ED Provider Notes (Signed)
CSN: [id number]     Arrival date & time 11/29/16  0903 History   First MD Initiated Contact with Patient 11/29/16 [phone]     Chief Complaint  Patient presents with  . Back Pain    lumbar   (Consider location/radiation/quality/duration/timing/severity/associated sxs/prior Treatment) Patient reports the pain started last night when she went to reach for a bottle of water. She believed she reached wrong; felt her back "grabbed" immediately during the reaching. Patient denies chronic history of back pain. Patient has tried ice and heat at home with no relief. Patient also took a couple Valium at home with no relief. Patient is on valium for her anxiety. Patient reports that walking and squatting helps the pain and standing stationary makes the pain worse. Also denies urinary symptoms.    The history is provided by the patient.  Back Pain  Location:  Lumbar spine Quality:  Shooting and burning (and twisting +pulling) Radiates to:  Does not radiate Pain severity:  Severe Onset quality:  Sudden Duration:  2 days Timing:  Constant Progression:  Worsening Chronicity:  New Associated symptoms: no abdominal pain, no abdominal swelling, no dysuria, no fever, no leg pain, no numbness, no paresthesias, no tingling and no weakness     Past Medical History:  Diagnosis Date  . Abdominal pain   . Anxiety   . Bipolar disorder ([place])   . FHx: cardiovascular disease   . H/O ovarian cystectomy   . Hyperlipidemia   . Nausea   . Smoker   . Tobacco use disorder    Past Surgical History:  Procedure Laterality Date  . ELBOW SURGERY     left elbow  . LAPAROSCOPIC APPENDECTOMY  10/23/2012   Procedure: APPENDECTOMY LAPAROSCOPIC;  Surgeon: Earnstine Regal, MD;  Location: [facility];  Service: General;  Laterality: N/A;  . LAPAROSCOPIC OVARIAN CYSTECTOMY    . LEEP    . RHINOPLASTY  age 46   due to broken nose  . TONSILLECTOMY     age 43   Family History  Problem Relation Age of Onset  . Heart disease  Father   . Hypertension Father   . Stroke Maternal Grandmother   . Cancer Maternal Grandmother     ovarian  . Stroke Paternal Grandmother   . Depression Paternal Grandmother   . Cancer Paternal Grandmother     breast, ovarian  . Cancer Maternal Aunt     ovarian  . Stroke Maternal Grandfather   . Stroke Paternal Grandfather    Social History  Substance Use Topics  . Smoking status: Current Every Day Smoker    Packs/day: 1.00    Years: 25.00    Types: Cigarettes  . Smokeless tobacco: Never Used  . Alcohol use Yes     Comment: 3-4 drinks a month   OB History    No data available     Review of Systems  Constitutional: Negative for fever.  Gastrointestinal: Negative for abdominal pain.  Genitourinary: Negative for dysuria.  Musculoskeletal: Positive for back pain.  Neurological: Negative for tingling, weakness, numbness and paresthesias.  All other systems reviewed and are negative.   Allergies  Other; Nitrous oxide; Sulfa antibiotics; and Tessalon perles [benzonatate]  Home Medications   Prior to Admission medications   Medication Sig Start Date End Date Taking? Authorizing Provider  Coenzyme Q10 (COQ-10) 400 MG CAPS Take by mouth.   Yes Historical Provider, MD  diazepam (VALIUM) 2 MG tablet TAKE 1 TABLET BY MOUTH EVERY 6 HOURS AS NEEDED  FOR ANXIETY 07/23/16  Yes Denita Lung, MD  LAMICTAL 200 MG tablet TAKE 1 TABLET BY MOUTH  DAILY 10/20/16  Yes Denita Lung, MD  meloxicam (MOBIC) 15 MG tablet Take 1 tablet (15 mg total) by mouth daily. 11/18/16  Yes Max T Hyatt, DPM  NEOMYCIN-POLYMYXIN-HYDROCORTISONE (CORTISPORIN) 1 % SOLN otic solution Apply 1-2 drops to toe BID after soaking 11/18/16  Yes Max T Hyatt, DPM  OVER THE COUNTER MEDICATION [place] Omega Trim   Yes Historical Provider, MD  OVER THE COUNTER MEDICATION [place] Trim   Yes Historical Provider, MD  valACYclovir (VALTREX) 1000 MG tablet TAKE 2 TABLETS BY MOUTH TWICE A DAY FOR 1 DAY 07/23/16  Yes Denita Lung, MD   cyclobenzaprine (FLEXERIL) 10 MG tablet Take 1 tablet (10 mg total) by mouth 3 (three) times daily as needed for muscle spasms. 11/29/16 12/04/16  Barry Dienes, NP  traMADol (ULTRAM) 50 MG tablet Take 1 tablet (50 mg total) by mouth every 8 (eight) hours as needed. 11/29/16 12/04/16  Barry Dienes, NP   Meds Ordered and Administered this Visit   Medications  ketorolac (TORADOL) injection 60 mg (not administered)    BP 115/74 (BP Location: Right Arm)   Pulse 77   Temp 98.4 F (36.9 C) (Oral)   Resp 17   Ht 5\' 4"  (1.626 m)   Wt 198 lb (89.8 kg)   SpO2 97%   BMI 33.99 kg/m  No data found.   Physical Exam  Constitutional: She is oriented to person, place, and time. She appears well-developed and well-nourished.  Cardiovascular: Normal rate, regular rhythm and normal heart sounds.   Pulmonary/Chest: Effort normal and breath sounds normal.  Abdominal: Soft. Bowel sounds are normal. She exhibits no distension. There is no tenderness.  Musculoskeletal:  Standing up in room; not wanting to sit down due to pain. Gait is slow. Lumber region and it surrounding area is tender to palpate. Has full ROM.   Neurological: She is alert and oriented to person, place, and time.  Skin: Skin is warm and dry.  Nursing note and vitals reviewed.   Urgent Care Course   Clinical Course     Procedures (including critical care time)  Labs Review Labs Reviewed - No data to display  Imaging Review No results found.  MDM   1. Acute midline low back pain without sciatica    1) Pain most likely muscular. No neurological deficit on exam.  2)Toradol 60 mg IM given.  3) Patient to continue meloxicam 15 mg at home for pain. Continue with heat therapy at home. 4) Take Tramadol every 8 hr PRN if meloxicam alone does not provide enough relief. [place] registry reviewed and is appropriate.  5) Flexeril also given to take TID PRN.  6) F/u with PCP if pain persist next week.        Barry Dienes, NP 11/29/16 [phone]

## 2016-11-30 NOTE — Telephone Encounter (Signed)
Called in valuim per Jcl

## 2016-11-30 NOTE — Telephone Encounter (Signed)
Is this okay to refill? 

## 2016-12-03 ENCOUNTER — Ambulatory Visit (INDEPENDENT_AMBULATORY_CARE_PROVIDER_SITE_OTHER): Payer: 59 | Admitting: Family Medicine

## 2016-12-03 ENCOUNTER — Encounter: Payer: Self-pay | Admitting: Family Medicine

## 2016-12-03 ENCOUNTER — Other Ambulatory Visit: Payer: Self-pay

## 2016-12-03 VITALS — BP 130/96 | HR 91 | Wt 206.0 lb

## 2016-12-03 DIAGNOSIS — S39012A Strain of muscle, fascia and tendon of lower back, initial encounter: Secondary | ICD-10-CM

## 2016-12-03 MED ORDER — TRAMADOL HCL 50 MG PO TABS: 50.0000 mg | ORAL_TABLET | Freq: Three times a day (TID) | ORAL | 0 refills | Status: AC | PRN

## 2016-12-03 NOTE — Progress Notes (Signed)
   Subjective:    Patient ID: Stacy Mckinney, female    DOB: [birth year], 46 y.o.   MRN: [id number]  HPI She injured herself January 1 and was seen in the emergency room for that. She was given muscle relaxers as well as tramadol. She states that the pain is now 3-4 during the day and can be as high as 6 at night. She is using her medications as appropriate. No numbness, tingling or weakness. Her main pain is in the right mid lumbar area.   Review of Systems     Objective:   Physical Exam Alert and complaining of slight back pain. Tender to palpation in the right upper lumbar paravertebral muscles. Pain on motion with flexion and lateral motion.       Assessment & Plan:  Back strain, initial encounter - Plan: traMADol (ULTRAM) 50 MG tablet Recommend heat 20 minutes 3 times per day, stretching after that. Also 2 Aleve twice per day as well as using the Flexeril only at night. I will renew her tramadol. Call if further trouble.

## 2016-12-03 NOTE — Patient Instructions (Signed)
Heat to the area for 20 minutes 3 times per day stretching after that 2 Aleve twice per day plus or tramadol massage as needed

## 2016-12-07 ENCOUNTER — Ambulatory Visit (INDEPENDENT_AMBULATORY_CARE_PROVIDER_SITE_OTHER): Payer: 59 | Admitting: Podiatry

## 2016-12-07 ENCOUNTER — Encounter: Payer: Self-pay | Admitting: Podiatry

## 2016-12-07 DIAGNOSIS — L600 Ingrowing nail: Secondary | ICD-10-CM

## 2016-12-07 NOTE — Patient Instructions (Signed)

## 2016-12-07 NOTE — Progress Notes (Signed)
She presents today for follow-up of the nail check matrixectomy hallux bilateral. He states that she is doing okay but she stubbed her left hallux. She's been soaking in Epsom salts but has lately not been able to do that because of painful back injury.  Objective: Vital signs are stable alert and oriented 3. Pulses are palpable. No erythema some mild ecchymosis to the distal aspect of the hallux left. No purulence no drainage.  Assessment: Well-healing matrixectomy hallux bilateral.  Plan: Well-healing matrixectomy bilateral contusion toe hallux left recommended she start soaking Epsom salts and warm water covered during the daytime leave open at bedtime. Follow-up with me if needed.Marland Kitchen

## 2017-01-26 ENCOUNTER — Telehealth: Payer: Self-pay | Admitting: Family Medicine

## 2017-01-26 NOTE — Telephone Encounter (Signed)
Pt called and stated that she continues to still have back pain. She states it did get better for a little while but now hurts just as much now. Please advise pt she can be reached at [phone].

## 2017-01-27 NOTE — Telephone Encounter (Signed)
Pt states it is not workers comp and has appointment 01/28/17

## 2017-01-27 NOTE — Telephone Encounter (Signed)
Make sure this is not Workmen's Comp. If it is not, schedule her to be seen here

## 2017-01-28 ENCOUNTER — Encounter: Payer: Self-pay | Admitting: Family Medicine

## 2017-01-28 ENCOUNTER — Ambulatory Visit (INDEPENDENT_AMBULATORY_CARE_PROVIDER_SITE_OTHER): Payer: 59 | Admitting: Family Medicine

## 2017-01-28 VITALS — BP 110/70 | HR 100 | Temp 97.6°F | Resp 18 | Wt 203.8 lb

## 2017-01-28 DIAGNOSIS — R350 Frequency of micturition: Secondary | ICD-10-CM

## 2017-01-28 DIAGNOSIS — M461 Sacroiliitis, not elsewhere classified: Secondary | ICD-10-CM | POA: Diagnosis not present

## 2017-01-28 DIAGNOSIS — N393 Stress incontinence (female) (male): Secondary | ICD-10-CM | POA: Diagnosis not present

## 2017-01-28 LAB — POCT URINALYSIS DIPSTICK
Bilirubin, UA: NEGATIVE
Glucose, UA: NEGATIVE
Ketones, UA: NEGATIVE
Leukocytes, UA: NEGATIVE
Nitrite, UA: NEGATIVE
PH UA: 6
PROTEIN UA: NEGATIVE
RBC UA: NEGATIVE
SPEC GRAV UA: 1.02
UROBILINOGEN UA: NEGATIVE

## 2017-01-28 NOTE — Progress Notes (Signed)
   Subjective:    Patient ID: Stacy Mckinney, female    DOB: [birth year], 46 y.o.   MRN: [id number]  HPI In January of this year she had difficulty with back pain from bending over. She did get conservative care including acupuncture. She states she got back to a pain level of 2-3 however 3 days ago she woke up with right-sided SI pain. She's been using ice and heat and to get acupuncture as well as ultrasound on it which has minimally helped. She then stated that she has had difficulty for the last several weeks with incontinence usually brought on by coughing or sneezing. She states that this is been giving her trouble really for the last several years but worse most recently. Yesterday she had an episode of incontinence not associated with stress. She has not ever mentioned this before.   Review of Systems     Objective:   Physical Exam alert and complaining of right-sided back pain. Tender to palpation over right upper SI joint. Figure-of-four and torqued test was positive. Negative straight leg raising. Good hip motion. Urinalysis is negative.      Assessment & Plan:  Urinary frequency - Plan: Urinalysis Dipstick  Stress incontinence in female  Sacroiliitis ([place]) I discussed the options of taking care of the sacroiliitis in terms of heat, stretching and chiropractic. Encouraged her to get manipulation on that as well as acupuncture and work on range of motion exercises. If no improvement will refer to physical therapy. She will call her gynecologist concerning getting further evaluation of her stress incontinence.

## 2017-03-07 ENCOUNTER — Other Ambulatory Visit: Payer: Self-pay | Admitting: Orthopedic Surgery

## 2017-03-07 DIAGNOSIS — M5441 Lumbago with sciatica, right side: Secondary | ICD-10-CM

## 2017-03-07 DIAGNOSIS — G8929 Other chronic pain: Secondary | ICD-10-CM

## 2017-03-07 DIAGNOSIS — M5416 Radiculopathy, lumbar region: Secondary | ICD-10-CM

## 2017-03-15 ENCOUNTER — Ambulatory Visit
Admission: RE | Admit: 2017-03-15 | Discharge: 2017-03-15 | Disposition: A | Discharge status: Home / Self Care | Payer: 59 | Source: Ambulatory Visit | Attending: Orthopedic Surgery | Admitting: Orthopedic Surgery

## 2017-03-15 DIAGNOSIS — M5136 Other intervertebral disc degeneration, lumbar region: Secondary | ICD-10-CM | POA: Diagnosis not present

## 2017-03-15 DIAGNOSIS — G8929 Other chronic pain: Secondary | ICD-10-CM | POA: Diagnosis not present

## 2017-03-15 DIAGNOSIS — M5441 Lumbago with sciatica, right side: Secondary | ICD-10-CM | POA: Diagnosis not present

## 2017-03-15 DIAGNOSIS — M5416 Radiculopathy, lumbar region: Secondary | ICD-10-CM | POA: Diagnosis not present

## 2017-03-17 ENCOUNTER — Other Ambulatory Visit: Payer: Self-pay | Admitting: Obstetrics & Gynecology

## 2017-03-17 DIAGNOSIS — Z1239 Encounter for other screening for malignant neoplasm of breast: Secondary | ICD-10-CM

## 2017-03-18 ENCOUNTER — Telehealth: Payer: Self-pay | Admitting: *Deleted

## 2017-03-18 MED ORDER — MELOXICAM 15 MG PO TABS: 15.0000 mg | ORAL_TABLET | Freq: Every day | ORAL | 0 refills | Status: DC

## 2017-03-18 NOTE — Telephone Encounter (Signed)
Received refill request for meloxicam. Dr. Marta Antu refill needs an appt if continues to have pain.

## 2017-05-18 ENCOUNTER — Other Ambulatory Visit: Payer: Self-pay | Admitting: Family Medicine

## 2017-05-18 ENCOUNTER — Encounter
Admission: RE | Admit: 2017-05-18 | Discharge: 2017-05-18 | Disposition: A | Discharge status: Home / Self Care | Payer: 59 | Source: Ambulatory Visit | Attending: Obstetrics & Gynecology | Admitting: Obstetrics & Gynecology

## 2017-05-18 DIAGNOSIS — Z01810 Encounter for preprocedural cardiovascular examination: Secondary | ICD-10-CM | POA: Insufficient documentation

## 2017-05-18 DIAGNOSIS — R0602 Shortness of breath: Secondary | ICD-10-CM | POA: Insufficient documentation

## 2017-05-18 DIAGNOSIS — Z01812 Encounter for preprocedural laboratory examination: Secondary | ICD-10-CM | POA: Insufficient documentation

## 2017-05-18 DIAGNOSIS — R102 Pelvic and perineal pain: Secondary | ICD-10-CM | POA: Insufficient documentation

## 2017-05-18 HISTORY — DX: Bladder disorder, unspecified: N32.9

## 2017-05-18 HISTORY — DX: Cardiac murmur, unspecified: R01.1

## 2017-05-18 LAB — TYPE AND SCREEN
ABO/RH(D): A POS
ANTIBODY SCREEN: NEGATIVE

## 2017-05-18 LAB — CBC
HEMATOCRIT: 40.9 % (ref 35.0–47.0)
Hemoglobin: 14.2 g/dL (ref 12.0–16.0)
MCH: 29.8 pg (ref 26.0–34.0)
MCHC: 34.6 g/dL (ref 32.0–36.0)
MCV: 86 fL (ref 80.0–100.0)
PLATELETS: 206 10*3/uL (ref 150–440)
RBC: 4.75 MIL/uL (ref 3.80–5.20)
RDW: 13.1 % (ref 11.5–14.5)
WBC: 5.9 10*3/uL (ref 3.6–11.0)

## 2017-05-18 LAB — SURGICAL PCR SCREEN
MRSA, PCR: NEGATIVE
Staphylococcus aureus: NEGATIVE

## 2017-05-18 LAB — BASIC METABOLIC PANEL
ANION GAP: 4 — AB (ref 5–15)
BUN: 13 mg/dL (ref 6–20)
CALCIUM: 9.6 mg/dL (ref 8.9–10.3)
CO2: 28 mmol/L (ref 22–32)
Chloride: 102 mmol/L (ref 101–111)
Creatinine, Ser: 0.74 mg/dL (ref 0.44–1.00)
GLUCOSE: 96 mg/dL (ref 65–99)
Potassium: 3.8 mmol/L (ref 3.5–5.1)
Sodium: 134 mmol/L — ABNORMAL LOW (ref 135–145)

## 2017-05-18 NOTE — Pre-Procedure Instructions (Signed)
Cha[facility]in called to assist with advanced directive.

## 2017-05-18 NOTE — H&P (Addendum)
Patient ID: Stacy Mckinney is a 46 y.o. female presenting with pre op visit  on 05/18/2017  HPI: Stacy Mckinney had presented with chronic pelvic pain and found to have a 3cm simple cyst of the ovary.  She is taking gapapentin daily for her pain and has requested removal of the uterus, cervix, and right ovary and bilateral tubes.  We had previously discussed interstitial cystitis, and while hysterectomy will not resolve this she is still interested in proceeding with this request.   Pap: neg/ neg 02/2017 TVUS:  Uterus, anteverted 7x6x4cm,  EE 7.65mm IUD in place RO: 4x4x2cm with 3cm simple cyst LO: 3x2x2cm with 1cm complex cyst   Past Medical History:  has a past medical history of Back pain, unspecified; Cervical dysplasia; Herpes labialis; abnormal cervical Pap smear (>20 years ago); Hyperlipidemia with target LDL less than 130, unspecified; Mood disorder (CMS-HCC); Obesity (BMI 30-39.9), unspecified; and Tobacco use.  Past Surgical History:  has a past surgical history that includes Appendectomy; Left elbow to repair nerve damage; Rhinoplasty; Tonsillectomy; Removal Ovarian Cyst; and Cervical biopsy w/ loop electrode excision (>20 years ago.). Family History: family history includes Breast cancer in her paternal grandmother; Depression in her paternal grandmother; Heart disease in her father; High blood pressure (Hypertension) in her father and maternal grandmother; Ovarian cancer in her maternal aunt, maternal grandmother, and paternal grandmother; Stroke in her maternal grandfather, maternal grandmother, paternal grandfather, and paternal grandmother. Social History:  reports that she has been smoking.  She has been smoking about 1.00 pack per day. She has never used smokeless tobacco. She reports that she drinks alcohol. She reports that she does not use drugs. OB/GYN History:          OB History    Gravida Para Term Preterm AB Living   0 0 0 0 0 0   SAB TAB Ectopic Molar Multiple Live Births    0 0 0 0 0 0      Allergies: is allergic to other; nitrous oxide; sulfa (sulfonamide antibiotics); and benzonatate. Medications:  Current Outpatient Prescriptions:  .  diazePAM (VALIUM) 2 MG tablet, TAKE 1 TABLET BY MOUTH EVERY 6 HOURS AS NEEDED ANXIETY, Disp: , Rfl:  .  gabapentin (NEURONTIN) 300 MG capsule, TAKE ONE CAPSULE BY NIGHTLY. INCREASE TO TWO AT NIGHT AFTER ONE WEEK IF NO IMPROVEMENT, Disp: 60 capsule, Rfl: 1 .  lamoTRIgine (LAMICTAL) 200 MG tablet, Take 1 tablet by mouth  daily, Disp: , Rfl:  .  valACYclovir (VALTREX) 1000 MG tablet, 2 pills twice a day for one day, Disp: , Rfl:  .  gabapentin (NEURONTIN) 300 MG capsule, Take 1 capsule (300 mg total) by mouth 3 (three) times daily., Disp: 90 capsule, Rfl: 0 .  ibuprofen (ADVIL,MOTRIN) 800 MG tablet, Take 1 tablet (800 mg total) by mouth every 6 (six) hours as needed for Pain for up to 10 days., Disp: 65 tablet, Rfl: 1 .  oxyCODONE (ROXICODONE) 5 MG immediate release tablet, Take 1 tablet (5 mg total) by mouth every 4 (four) hours as needed for Pain for up to 5 days., Disp: 21 tablet, Rfl: 0 .  scopolamine (TRANSDERM-SCOP) 1 mg over 3 days patch, Place 1 patch (1.5 mg total) onto the skin every third day., Disp: 4 patch, Rfl: 0   Review of Systems: No SOB, no palpitations or chest pain, no new lower extremity edema, no nausea or vomiting or bowel or bladder complaints. See HPI for gyn specific ROS.   Exam:   BP 121/88  Pulse 87   Ht 162.6 cm (5\' 4" )   Wt 93.4 kg (206 lb)   BMI 35.36 kg/m   General: Patient is well-groomed, well-nourished, appears stated age in no acute distress  HEENT: head is atraumatic and normocephalic, trachea is midline, neck is supple with no palpable nodules  CV: Regular rhythm and normal heart rate, no murmur  Pulm: Clear to auscultation throughout lung fields with no wheezing, crackles, or rhonchi. No increased work of breathing  Abdomen: soft , no mass, non-tender, no rebound  tenderness, no hepatomegaly  Pelvic: previously documented as: tanner stage 5 ,              External genitalia: vulva /labia no lesions             Urethra: no prolapse             Vagina: normal physiologic d/c, laxity in vaginal walls             Cervix: no lesions, no cervical motion tenderness, good descent             Uterus: normal size shape and contour, non-tender             Adnexa: no mass,  non-tender               Rectovaginal: External wnl   Impression:   Chronic pelvic pain with ovarian cyst, requesting hysterectomy and oophorectomy    Plan:   Proposed surgery: TLH BS, Right oophorectomy.  Cystoscopy.  The patient and I discussed the technical aspects of the procedure including the potential for risks and complications.These include but are not limited to the risk of infection requiring post-operative antibiotics or further procedures.We talked about the risk of injury to adjacent organs including bladder, bowel, ureter, blood vessels or nerves, the need to convert to an open incision, possibleneed for blood transfusion andpostop complications such asthromboembolic or cardiopulmonary complications.All of her questions were answered. Her preoperative exam was completed andthe appropriate consents were signed. She is scheduled to undergo this procedure in the near future.  I personally performed the service.  (TP)  Jahzeel Poythress CRIST Davian Wollenberg, MD

## 2017-05-18 NOTE — Patient Instructions (Signed)
Your procedure is scheduled on: 05/23/17 Mon Report to Same Day Surgery 2nd floor medical mall [facility] Entrance-take elevator on left to 2nd floor.  Check in with surgery information desk.) To find out your arrival time please call [phone] between 1PM - 3PM on 05/20/17 Fri  Remember: Instructions that are not followed completely may result in serious medical risk, up to and including death, or upon the discretion of your surgeon and anesthesiologist your surgery may need to be rescheduled.    _x___ 1. Do not eat food or drink liquids after midnight. No gum chewing or                              hard candies.     __x__ 2. No Alcohol for 24 hours before or after surgery.   __x__3. No Smoking for 24 prior to surgery.   ____  4. Bring all medications with you on the day of surgery if instructed.    __x__ 5. Notify your doctor if there is any change in your medical condition     (cold, fever, infections).     Do not wear jewelry, make-up, hairpins, clips or nail polish.  Do not wear lotions, powders, or perfumes. You may wear deodorant.  Do not shave 48 hours prior to surgery. Men may shave face and neck.  Do not bring valuables to the hospital.    [facility] is not responsible for any belongings or valuables.               Contacts, dentures or bridgework may not be worn into surgery.  Leave your suitcase in the car. After surgery it may be brought to your room.  For patients admitted to the hospital, discharge time is determined by your                       treatment team.   Patients discharged the day of surgery will not be allowed to drive home.  You will need someone to drive you home and stay with you the night of your procedure.    Please read over the following fact sheets that you were given:   [facility] Preparing for Surgery and or MRSA Information   _x___ Take anti-hypertensive (unless it includes a diuretic), cardiac, seizure, asthma,     anti-reflux and  psychiatric medicines. These include:  1. diazepam (VALIUM)   2.pantoprazole (PROTONIX)  3.LAMICTAL 200 MG   4.  5.  6.  ____Fleets enema or Magnesium Citrate as directed.   _x___ Use CHG Soap or sage wipes as directed on instruction sheet   ____ Use inhalers on the day of surgery and bring to hospital day of surgery  ____ Stop Metformin and Janumet 2 days prior to surgery.    ____ Take 1/2 of usual insulin dose the night before surgery and none on the morning     surgery.   _x___ Follow recommendations from Cardiologist, Pulmonologist or PCP regarding          stopping Aspirin, Coumadin, Pllavix ,Eliquis, Effient, or Pradaxa, and Pletal.  X____Stop Anti-inflammatories such as Advil, Aleve, Ibuprofen, Motrin, Naproxen, Naprosyn, Goodies powders or aspirin products. OK to take Tylenol and                          Celebrex.   _x___ Stop supplements until after surgery.  But  may continue Vitamin D, Vitamin B,       and multivitamin.   ____ Bring C-Pap to the hospital.

## 2017-05-19 NOTE — Telephone Encounter (Signed)
I have called in diazepam per jcl

## 2017-05-19 NOTE — Telephone Encounter (Signed)
Is this okay to refill? 

## 2017-05-19 NOTE — Telephone Encounter (Signed)
ok 

## 2017-05-22 MED ORDER — CEFAZOLIN SODIUM-DEXTROSE 2-4 GM/100ML-% IV SOLN
2.0000 g | Freq: Once | INTRAVENOUS | Status: AC
  Administered 2017-05-23: 2 g via INTRAVENOUS

## 2017-05-23 ENCOUNTER — Encounter: Payer: Self-pay | Admitting: *Deleted

## 2017-05-23 ENCOUNTER — Ambulatory Visit
Admission: RE | Admit: 2017-05-23 | Discharge: 2017-05-23 | Disposition: A | Discharge status: Home / Self Care | Payer: 59 | Source: Ambulatory Visit | Attending: Obstetrics & Gynecology | Admitting: Obstetrics & Gynecology

## 2017-05-23 ENCOUNTER — Ambulatory Visit: Payer: 59 | Admitting: Anesthesiology

## 2017-05-23 ENCOUNTER — Encounter
Admission: RE | Disposition: A | Discharge status: Home / Self Care | Payer: Self-pay | Source: Ambulatory Visit | Attending: Obstetrics & Gynecology

## 2017-05-23 DIAGNOSIS — N941 Unspecified dyspareunia: Secondary | ICD-10-CM | POA: Insufficient documentation

## 2017-05-23 DIAGNOSIS — F319 Bipolar disorder, unspecified: Secondary | ICD-10-CM | POA: Insufficient documentation

## 2017-05-23 DIAGNOSIS — Z8249 Family history of ischemic heart disease and other diseases of the circulatory system: Secondary | ICD-10-CM | POA: Diagnosis not present

## 2017-05-23 DIAGNOSIS — N946 Dysmenorrhea, unspecified: Secondary | ICD-10-CM | POA: Insufficient documentation

## 2017-05-23 DIAGNOSIS — Z6834 Body mass index (BMI) 34.0-34.9, adult: Secondary | ICD-10-CM | POA: Diagnosis not present

## 2017-05-23 DIAGNOSIS — Z8041 Family history of malignant neoplasm of ovary: Secondary | ICD-10-CM | POA: Insufficient documentation

## 2017-05-23 DIAGNOSIS — Z79899 Other long term (current) drug therapy: Secondary | ICD-10-CM | POA: Diagnosis not present

## 2017-05-23 DIAGNOSIS — Z882 Allergy status to sulfonamides status: Secondary | ICD-10-CM | POA: Diagnosis not present

## 2017-05-23 DIAGNOSIS — D251 Intramural leiomyoma of uterus: Secondary | ICD-10-CM | POA: Diagnosis not present

## 2017-05-23 DIAGNOSIS — N8301 Follicular cyst of right ovary: Secondary | ICD-10-CM | POA: Insufficient documentation

## 2017-05-23 DIAGNOSIS — Z975 Presence of (intrauterine) contraceptive device: Secondary | ICD-10-CM | POA: Insufficient documentation

## 2017-05-23 DIAGNOSIS — F419 Anxiety disorder, unspecified: Secondary | ICD-10-CM | POA: Insufficient documentation

## 2017-05-23 DIAGNOSIS — F39 Unspecified mood [affective] disorder: Secondary | ICD-10-CM | POA: Diagnosis not present

## 2017-05-23 DIAGNOSIS — Z803 Family history of malignant neoplasm of breast: Secondary | ICD-10-CM | POA: Diagnosis not present

## 2017-05-23 DIAGNOSIS — G8929 Other chronic pain: Secondary | ICD-10-CM | POA: Insufficient documentation

## 2017-05-23 DIAGNOSIS — R011 Cardiac murmur, unspecified: Secondary | ICD-10-CM | POA: Diagnosis not present

## 2017-05-23 DIAGNOSIS — E785 Hyperlipidemia, unspecified: Secondary | ICD-10-CM | POA: Insufficient documentation

## 2017-05-23 DIAGNOSIS — F17210 Nicotine dependence, cigarettes, uncomplicated: Secondary | ICD-10-CM | POA: Diagnosis not present

## 2017-05-23 DIAGNOSIS — Z888 Allergy status to other drugs, medicaments and biological substances status: Secondary | ICD-10-CM | POA: Diagnosis not present

## 2017-05-23 DIAGNOSIS — Z823 Family history of stroke: Secondary | ICD-10-CM | POA: Diagnosis not present

## 2017-05-23 DIAGNOSIS — R102 Pelvic and perineal pain: Secondary | ICD-10-CM | POA: Insufficient documentation

## 2017-05-23 DIAGNOSIS — E669 Obesity, unspecified: Secondary | ICD-10-CM | POA: Diagnosis not present

## 2017-05-23 HISTORY — PX: LAPAROSCOPIC UNILATERAL SALPINGO OOPHERECTOMY: SHX5935

## 2017-05-23 HISTORY — PX: LAPAROSCOPIC UNILATERAL SALPINGECTOMY: SHX5934

## 2017-05-23 HISTORY — PX: CYSTOSCOPY: SHX5120

## 2017-05-23 HISTORY — PX: LAPAROSCOPIC HYSTERECTOMY: SHX1926

## 2017-05-23 LAB — ABO/RH: ABO/RH(D): A POS

## 2017-05-23 LAB — POCT PREGNANCY, URINE: Preg Test, Ur: NEGATIVE

## 2017-05-23 SURGERY — HYSTERECTOMY, TOTAL, LAPAROSCOPIC
Anesthesia: General | Laterality: Right

## 2017-05-23 MED ORDER — ROCURONIUM BROMIDE 100 MG/10ML IV SOLN
INTRAVENOUS | Status: DC | PRN
  Administered 2017-05-23: 50 mg via INTRAVENOUS
  Administered 2017-05-23 (×2): 20 mg via INTRAVENOUS

## 2017-05-23 MED ORDER — PROPOFOL 10 MG/ML IV BOLUS
INTRAVENOUS | Status: AC
  Filled 2017-05-23: qty 40

## 2017-05-23 MED ORDER — OXYCODONE HCL 5 MG PO TABS
ORAL_TABLET | ORAL | Status: AC
  Filled 2017-05-23: qty 1

## 2017-05-23 MED ORDER — PROPOFOL 10 MG/ML IV BOLUS
INTRAVENOUS | Status: DC | PRN
  Administered 2017-05-23: 180 mg via INTRAVENOUS

## 2017-05-23 MED ORDER — FENTANYL CITRATE (PF) 100 MCG/2ML IJ SOLN
INTRAMUSCULAR | Status: DC | PRN
  Administered 2017-05-23: 50 ug via INTRAVENOUS
  Administered 2017-05-23: 100 ug via INTRAVENOUS

## 2017-05-23 MED ORDER — FENTANYL CITRATE (PF) 100 MCG/2ML IJ SOLN
INTRAMUSCULAR | Status: AC
  Administered 2017-05-23: 50 ug via INTRAVENOUS
  Filled 2017-05-23: qty 2

## 2017-05-23 MED ORDER — ONDANSETRON HCL 4 MG/2ML IJ SOLN
INTRAMUSCULAR | Status: DC | PRN
  Administered 2017-05-23: 4 mg via INTRAVENOUS

## 2017-05-23 MED ORDER — DEXAMETHASONE SODIUM PHOSPHATE 10 MG/ML IJ SOLN
INTRAMUSCULAR | Status: DC | PRN
  Administered 2017-05-23: 10 mg via INTRAVENOUS

## 2017-05-23 MED ORDER — MORPHINE SULFATE (PF) 4 MG/ML IV SOLN: 1.0000 mg | INTRAVENOUS | Status: DC | PRN

## 2017-05-23 MED ORDER — ACETAMINOPHEN 500 MG PO TABS
ORAL_TABLET | ORAL | Status: AC
  Administered 2017-05-23: 1000 mg via ORAL
  Filled 2017-05-23: qty 2

## 2017-05-23 MED ORDER — OXYCODONE HCL 5 MG/5ML PO SOLN
5.0000 mg | Freq: Once | ORAL | Status: AC | PRN
  Administered 2017-05-23: 5 mg via ORAL

## 2017-05-23 MED ORDER — FLUORESCEIN SODIUM 10 % IV SOLN
INTRAVENOUS | Status: AC
  Filled 2017-05-23: qty 5

## 2017-05-23 MED ORDER — MIDAZOLAM HCL 2 MG/2ML IJ SOLN
INTRAMUSCULAR | Status: AC
Start: 2017-05-23 — End: 2017-05-23
  Filled 2017-05-23: qty 2

## 2017-05-23 MED ORDER — ACETAMINOPHEN 650 MG RE SUPP
650.0000 mg | RECTAL | Status: DC | PRN
  Filled 2017-05-23: qty 1

## 2017-05-23 MED ORDER — EPHEDRINE SULFATE 50 MG/ML IJ SOLN
INTRAMUSCULAR | Status: DC | PRN
  Administered 2017-05-23 (×2): 10 mg via INTRAVENOUS

## 2017-05-23 MED ORDER — LIDOCAINE HCL (PF) 2 % IJ SOLN
INTRAMUSCULAR | Status: AC
  Filled 2017-05-23: qty 2

## 2017-05-23 MED ORDER — OXYCODONE HCL 5 MG PO TABS: 5.0000 mg | ORAL_TABLET | Freq: Once | ORAL | Status: AC | PRN

## 2017-05-23 MED ORDER — KETOROLAC TROMETHAMINE 30 MG/ML IJ SOLN
30.0000 mg | Freq: Four times a day (QID) | INTRAMUSCULAR | Status: DC
  Administered 2017-05-23: 30 mg via INTRAVENOUS

## 2017-05-23 MED ORDER — BUPIVACAINE HCL (PF) 0.5 % IJ SOLN
INTRAMUSCULAR | Status: AC
  Filled 2017-05-23: qty 30

## 2017-05-23 MED ORDER — PHENYLEPHRINE HCL 10 MG/ML IJ SOLN
INTRAMUSCULAR | Status: AC
  Filled 2017-05-23: qty 1

## 2017-05-23 MED ORDER — FENTANYL CITRATE (PF) 250 MCG/5ML IJ SOLN
INTRAMUSCULAR | Status: AC
  Filled 2017-05-23: qty 5

## 2017-05-23 MED ORDER — ACETAMINOPHEN 500 MG PO TABS
1000.0000 mg | ORAL_TABLET | Freq: Once | ORAL | Status: AC
  Administered 2017-05-23: 1000 mg via ORAL

## 2017-05-23 MED ORDER — KETOROLAC TROMETHAMINE 30 MG/ML IJ SOLN
INTRAMUSCULAR | Status: AC
  Filled 2017-05-23: qty 1

## 2017-05-23 MED ORDER — LIDOCAINE HCL (CARDIAC) 20 MG/ML IV SOLN
INTRAVENOUS | Status: DC | PRN
  Administered 2017-05-23: 100 mg via INTRAVENOUS

## 2017-05-23 MED ORDER — SUGAMMADEX SODIUM 200 MG/2ML IV SOLN
INTRAVENOUS | Status: DC | PRN
  Administered 2017-05-23: 180 mg via INTRAVENOUS

## 2017-05-23 MED ORDER — SUGAMMADEX SODIUM 200 MG/2ML IV SOLN
INTRAVENOUS | Status: AC
  Filled 2017-05-23: qty 2

## 2017-05-23 MED ORDER — FENTANYL CITRATE (PF) 100 MCG/2ML IJ SOLN
25.0000 ug | INTRAMUSCULAR | Status: DC | PRN
  Administered 2017-05-23 (×2): 50 ug via INTRAVENOUS

## 2017-05-23 MED ORDER — CELECOXIB 200 MG PO CAPS
400.0000 mg | ORAL_CAPSULE | Freq: Once | ORAL | Status: AC
  Administered 2017-05-23: 400 mg via ORAL

## 2017-05-23 MED ORDER — ROCURONIUM BROMIDE 50 MG/5ML IV SOLN
INTRAVENOUS | Status: AC
  Filled 2017-05-23: qty 1

## 2017-05-23 MED ORDER — CELECOXIB 200 MG PO CAPS
ORAL_CAPSULE | ORAL | Status: AC
  Administered 2017-05-23: 400 mg via ORAL
  Filled 2017-05-23: qty 2

## 2017-05-23 MED ORDER — LACTATED RINGERS IV SOLN
INTRAVENOUS | Status: DC
  Administered 2017-05-23 (×3): via INTRAVENOUS

## 2017-05-23 MED ORDER — CEFAZOLIN SODIUM-DEXTROSE 2-4 GM/100ML-% IV SOLN
INTRAVENOUS | Status: AC
  Filled 2017-05-23: qty 100

## 2017-05-23 MED ORDER — ONDANSETRON HCL 4 MG/2ML IJ SOLN
INTRAMUSCULAR | Status: AC
  Filled 2017-05-23: qty 2

## 2017-05-23 MED ORDER — ACETAMINOPHEN 325 MG PO TABS: 650.0000 mg | ORAL_TABLET | ORAL | Status: DC | PRN

## 2017-05-23 MED ORDER — HEPARIN SODIUM (PORCINE) 5000 UNIT/ML IJ SOLN
5000.0000 [IU] | Freq: Once | INTRAMUSCULAR | Status: AC
  Administered 2017-05-23: 5000 [IU] via SUBCUTANEOUS

## 2017-05-23 MED ORDER — HEPARIN SODIUM (PORCINE) 5000 UNIT/ML IJ SOLN
INTRAMUSCULAR | Status: AC
Start: 2017-05-23 — End: 2017-05-23
  Administered 2017-05-23: 5000 [IU] via SUBCUTANEOUS
  Filled 2017-05-23: qty 1

## 2017-05-23 SURGICAL SUPPLY — 68 items
BACTOSHIELD CHG 4% 4OZ (MISCELLANEOUS) ×2
BAG URO DRAIN 2000ML W/SPOUT (MISCELLANEOUS) ×6 IMPLANT
BLADE SURG SZ11 CARB STEEL (BLADE) ×6 IMPLANT
CANISTER SUCT 1200ML W/VALVE (MISCELLANEOUS) ×6 IMPLANT
CATH FOLEY 2WAY  5CC 16FR (CATHETERS) ×2
CATH ROBINSON RED A/P 16FR (CATHETERS) ×6 IMPLANT
CATH URTH 16FR FL 2W BLN LF (CATHETERS) ×4 IMPLANT
CHLORAPREP W/TINT 26ML (MISCELLANEOUS) ×12 IMPLANT
DEFOGGER SCOPE WARMER CLEARIFY (MISCELLANEOUS) ×6 IMPLANT
DERMABOND ADVANCED (GAUZE/BANDAGES/DRESSINGS) ×2
DERMABOND ADVANCED .7 DNX12 (GAUZE/BANDAGES/DRESSINGS) ×4 IMPLANT
DRAPE LEGGINS SURG 28X43 STRL (DRAPES) ×6 IMPLANT
DRAPE SHEET LG 3/4 BI-LAMINATE (DRAPES) ×6 IMPLANT
DRAPE UNDER BUTTOCK W/FLU (DRAPES) ×6 IMPLANT
DRSG TEGADERM 2-3/8X2-3/4 SM (GAUZE/BANDAGES/DRESSINGS) ×12 IMPLANT
DRSG TELFA 4X3 1S NADH ST (GAUZE/BANDAGES/DRESSINGS) ×18 IMPLANT
ENDOPOUCH RETRIEVER 10 (MISCELLANEOUS) ×6 IMPLANT
GAUZE SPONGE NON-WVN 2X2 STRL (MISCELLANEOUS) ×8 IMPLANT
GLOVE PI ORTHOPRO 6.5 (GLOVE) ×2
GLOVE PI ORTHOPRO STRL 6.5 (GLOVE) ×4 IMPLANT
GLOVE SURG SYN 6.5 ES PF (GLOVE) ×18 IMPLANT
GOWN STRL REUS W/ TWL LRG LVL3 (GOWN DISPOSABLE) ×12 IMPLANT
GOWN STRL REUS W/ TWL XL LVL3 (GOWN DISPOSABLE) ×4 IMPLANT
GOWN STRL REUS W/TWL LRG LVL3 (GOWN DISPOSABLE) ×6
GOWN STRL REUS W/TWL XL LVL3 (GOWN DISPOSABLE) ×2
GRASPER SUT TROCAR 14GX15 (MISCELLANEOUS) ×6 IMPLANT
IRRIGATION STRYKERFLOW (MISCELLANEOUS) ×4 IMPLANT
IRRIGATOR STRYKERFLOW (MISCELLANEOUS) ×6
IV LACTATED RINGERS 1000ML (IV SOLUTION) ×6 IMPLANT
KIT PINK PAD W/HEAD ARE REST (MISCELLANEOUS) ×6
KIT PINK PAD W/HEAD ARM REST (MISCELLANEOUS) ×4 IMPLANT
KIT RM TURNOVER CYSTO AR (KITS) ×6 IMPLANT
L-HOOK LAP DISP 36CM (ELECTROSURGICAL) ×6
LABEL OR SOLS (LABEL) IMPLANT
LHOOK LAP DISP 36CM (ELECTROSURGICAL) ×4 IMPLANT
LIGASURE BLUNT 5MM 37CM (INSTRUMENTS) ×6 IMPLANT
LIGASURE LAP MARYLAND 5MM 37CM (ELECTROSURGICAL) ×6 IMPLANT
MANIPULATOR UTERINE 4.5 ZUMI (MISCELLANEOUS) ×6 IMPLANT
MANIPULATOR VCARE LG CRV RETR (MISCELLANEOUS) IMPLANT
MANIPULATOR VCARE SML CRV RETR (MISCELLANEOUS) ×6 IMPLANT
MANIPULATOR VCARE STD CRV RETR (MISCELLANEOUS) IMPLANT
NEEDLE VERESS 14GA 120MM (NEEDLE) ×6 IMPLANT
NS IRRIG 500ML POUR BTL (IV SOLUTION) ×6 IMPLANT
OCCLUDER COLPOPNEUMO (BALLOONS) IMPLANT
PACK LAP CHOLECYSTECTOMY (MISCELLANEOUS) ×6 IMPLANT
PAD OB MATERNITY 4.3X12.25 (PERSONAL CARE ITEMS) ×6 IMPLANT
PAD PREP 24X41 OB/GYN DISP (PERSONAL CARE ITEMS) ×6 IMPLANT
PENCIL ELECTRO HAND CTR (MISCELLANEOUS) ×6 IMPLANT
SCISSORS METZENBAUM CVD 33 (INSTRUMENTS) ×6 IMPLANT
SCRUB CHG 4% DYNA-HEX 4OZ (MISCELLANEOUS) ×4 IMPLANT
SET CYSTO W/LG BORE CLAMP LF (SET/KITS/TRAYS/PACK) IMPLANT
SET YANKAUER POOLE SUCT (MISCELLANEOUS) ×6 IMPLANT
SHEARS HARMONIC ACE PLUS 36CM (ENDOMECHANICALS) ×6 IMPLANT
SLEEVE ENDOPATH XCEL 5M (ENDOMECHANICALS) ×12 IMPLANT
SPONGE VERSALON 2X2 STRL (MISCELLANEOUS) ×4
SURGILUBE 2OZ TUBE FLIPTOP (MISCELLANEOUS) ×6 IMPLANT
SUT MNCRL 4-0 (SUTURE) ×2
SUT MNCRL 4-0 27XMFL (SUTURE) ×4
SUT MNCRL AB 4-0 PS2 18 (SUTURE) ×6 IMPLANT
SUT VIC AB 0 CT1 36 (SUTURE) ×12 IMPLANT
SUT VIC AB 2-0 UR6 27 (SUTURE) ×6 IMPLANT
SUT VIC AB 4-0 PS2 18 (SUTURE) ×6 IMPLANT
SUTURE MNCRL 4-0 27XMF (SUTURE) ×4 IMPLANT
SYRINGE 10CC LL (SYRINGE) ×6 IMPLANT
TROCAR ENDO BLADELESS 11MM (ENDOMECHANICALS) IMPLANT
TROCAR XCEL NON-BLD 5MMX100MML (ENDOMECHANICALS) ×6 IMPLANT
TUBING INSUF HEATED (TUBING) ×6 IMPLANT
TUBING INSUFFLATOR HI FLOW (MISCELLANEOUS) ×6 IMPLANT

## 2017-05-23 NOTE — Anesthesia Preprocedure Evaluation (Signed)
Anesthesia Evaluation  Patient identified by MRN, date of birth, ID band Patient awake    Reviewed: Allergy & Precautions, H&P , NPO status , Patient's Chart, lab work & pertinent test results  History of Anesthesia Complications (+) PROLONGED EMERGENCE and history of anesthetic complications  Airway Mallampati: III  TM Distance: >3 FB Neck ROM: full    Dental  (+) Poor Dentition, Chipped   Pulmonary Current Smoker,           Cardiovascular Exercise Tolerance: Good (-) angina(-) Past MI + dysrhythmias      Neuro/Psych PSYCHIATRIC DISORDERS Anxiety Bipolar Disorder negative neurological ROS     GI/Hepatic negative GI ROS, Neg liver ROS, neg GERD  ,  Endo/Other  negative endocrine ROS  Renal/GU      Musculoskeletal   Abdominal   Peds  Hematology negative hematology ROS (+)   Anesthesia Other Findings Past Medical History: No date: Abdominal pain No date: Anxiety No date: Bipolar disorder ([place]) No date: Bladder disorder No date: FHx: cardiovascular disease No date: H/O ovarian cystectomy No date: Heart murmur No date: Hyperlipidemia No date: Nausea No date: Smoker No date: Tobacco use disorder  Past Surgical History: No date: ELBOW SURGERY     Comment: left elbow 10/23/2012: LAPAROSCOPIC APPENDECTOMY     Comment: Procedure: APPENDECTOMY LAPAROSCOPIC;                Surgeon: Earnstine Regal, MD;  Location: [facility];               Service: General;  Laterality: N/A; No date: LAPAROSCOPIC OVARIAN CYSTECTOMY No date: LEEP age 52: RHINOPLASTY     Comment: due to broken nose No date: TONSILLECTOMY     Comment: age 58  BMI    Body Mass Index:  34.33 kg/m      Reproductive/Obstetrics negative OB ROS                             Anesthesia Physical Anesthesia Plan  ASA: III  Anesthesia Plan: General ETT   Post-op Pain Management:    Induction: Intravenous  PONV Risk Score and  Plan: 4 or greater and Ondansetron, Dexamethasone, Propofol and Midazolam  Airway Management Planned: Oral ETT  Additional Equipment:   Intra-op Plan:   Post-operative Plan: Extubation in OR  Informed Consent: I have reviewed the patients History and Physical, chart, labs and discussed the procedure including the risks, benefits and alternatives for the proposed anesthesia with the patient or authorized representative who has indicated his/her understanding and acceptance.   Dental Advisory Given  Plan Discussed with: Anesthesiologist, CRNA and Surgeon  Anesthesia Plan Comments: (Patient consented for risks of anesthesia including but not limited to:  - adverse reactions to medications - damage to teeth, lips or other oral mucosa - sore throat or hoarseness - Damage to heart, brain, lungs or loss of life  Patient voiced understanding.)        Anesthesia Quick Evaluation

## 2017-05-23 NOTE — Interval H&P Note (Signed)
History and Physical Interval Note:  05/23/2017 6:53 AM  Stacy Mckinney  has presented today for surgery, with the diagnosis of Pelvic Pain  The various methods of treatment have been discussed with the patient and family. After consideration of risks, benefits and other options for treatment, the patient has consented to  Procedure(s): HYSTERECTOMY TOTAL LAPAROSCOPIC (N/A) LAPAROSCOPIC UNILATERAL SALPINGO OOPHORECTOMY (Right) LAPAROSCOPIC UNILATERAL SALPINGECTOMY (Left) as a surgical intervention .  The patient's history has been reviewed, patient examined, no change in status, stable for surgery.  I have reviewed the patient's chart and labs.  Questions were answered to the patient's satisfaction.     [place]

## 2017-05-23 NOTE — Discharge Instructions (Addendum)
Discharge instructions:  Call office if you have any of the following: fever >101 F, chills, excessive vaginal bleeding, incision drainage or problems, leg pain or redness, or any other concerns.   Activity: Do not lift > 10 lbs for 8 weeks.  No intercourse (vaginal or anal or any other kind) or tampons for 8 weeks.  No driving for 1-2 weeks.   You may feel some pain in your upper right abdomen/rib and right shoulder.  This is from the gas in the abdomen for surgery. This will subside over time, please be patient!  Take 600mg  Ibuprofen and 1000mg  Tylenol around the clock, every 6 hours for at least the first 3-5 days.  After this you can take as needed.  This will help decrease inflammation and promote healing.  The narcotics you'll take just as needed, as they just trick your brain into thinking its not in pain.    Please don't limit yourself in terms of routine activity.  You will be able to do most things, although they may take longer to do or be a little painful.  You can do it!  Don't be a hero, but don't be a wimp either!    AMBULATORY SURGERY  DISCHARGE INSTRUCTIONS   1) The drugs that you were given will stay in your system until tomorrow so for the next 24 hours you should not:  A) Drive an automobile B) Make any legal decisions C) Drink any alcoholic beverage   2) You may resume regular meals tomorrow.  Today it is better to start with liquids and gradually work up to solid foods.  You may eat anything you prefer, but it is better to start with liquids, then soup and crackers, and gradually work up to solid foods.   3) Please notify your doctor immediately if you have any unusual bleeding, trouble breathing, redness and pain at the surgery site, drainage, fever, or pain not relieved by medication.    4) Additional Instructions:        Please contact your physician with any problems or Same Day Surgery at [phone], Monday through Friday 6 am to 4 pm, or [facility] number at [phone].

## 2017-05-23 NOTE — Op Note (Signed)
Total Laparoscopic Hysterectomy Operative Note Procedure Date: 05/23/2017  Patient:  Stacy Mckinney  46 y.o. female  PRE-OPERATIVE DIAGNOSIS:  Pelvic Pain  POST-OPERATIVE DIAGNOSIS:  Pelvic Pain  PROCEDURE:  Procedure(s): HYSTERECTOMY TOTAL LAPAROSCOPIC (N/A) LAPAROSCOPIC UNILATERAL SALPINGO OOPHORECTOMY (Right) LAPAROSCOPIC UNILATERAL SALPINGECTOMY (Left) CYSTOSCOPY  SURGEON:  Surgeon(s) and Role:    * Nadav Swindell, Honor Loh, MD - Primary    * Benjaman Kindler, MD - Assisting  ANESTHESIA:  General via ET  I/O  Total I/O In: 1000 [I.V.:1000] Out: 620 [Urine:600; Blood:20]  FINDINGS:  Small uterus, normal ovaries and fallopian tubes bilaterally.  Normal upper abdomen.  SPECIMEN: Uterus, Cervix, bilateral fallopian tubes, right ovary  COMPLICATIONS: none apparent  DISPOSITION: vital signs stable to PACU  Indication for Surgery: 46 y.o. with chronic pelvic pain, particularly on the right side, who presented as an outpatient with a long history of dysmenorrhea, dyspareunia, and intramenstrual pain s/p several modalities to treat and currently with progestin IUD in place.  She requested hysterectomy and removal of right ovary.  Risks of surgery were discussed with the patient including but not limited to: bleeding which may require transfusion or reoperation; infection which may require antibiotics; injury to bowel, bladder, ureters or other surrounding organs; need for additional procedures including laparotomy, blood clot, incisional problems and other postoperative/anesthesia complications. Written informed consent was obtained.      PROCEDURE IN DETAIL:  The patient had 5000u Heparin Sub-q and sequential compression devices applied to her lower extremities while in the preoperative area.  She was then taken to the operating room. IV antibiotics were given. General anesthesia was administered via endotracheal route.  She was placed in the dorsal lithotomy position, and was prepped and  draped in a sterile manner. A surgical time-out was performed.  A Foley catheter was inserted into her bladder and attached to constant drainage and a V-Care uterine manipulator was then advanced into the uterus and a good fit around the cervix was noted. The gloves were changed, and attention was turned to the abdomen where an umbilical incision was made with the scalpel.  A 81mm trochar was inserted in the umbilical incision using a visiport method.Opening pressure was 98mmHg, and the abdomen was insufflated to 15mg Hg carbon dioxide gas and adequate pneumoperitoneum was obtained. A survey of the patient's pelvis and abdomen revealed the findings as mentioned above. Two 5mm ports were inserted in the lower left and right quadrants under visualization.     The bilateral ureters were visualized vermiculating.  The left fallopian tube was separated from the mesosalpinx using the Ligasure, The left uteroovarian ligament was sealed and divided, the left round ligament was sealed and divided. The right round ligament was sealed and divided, and the peritoneum along the IP was transected.  The IP was drawn medially, away from the sidewall and ureter, and thrice sealed then divided. The posterior leaf of the broad ligament was divided to the uterosacral ligament. The bilateral anterior broad ligaments were divided and brought across the uterus to separate the vesicouterine peritoneum and create a bladder flap. The bladder was pushed away from the uterus. The bilateral uterine arteries were skeletonized, ligated and transected. The bilateral uterosacral and cardinal ligaments were ligated and transected. The bladder was back filled to assure the bladder had been safely pushed out of the surgical field.  A colpotomy was made around the V-Care cervical cup and the uterus, cervix, and bilateral tubes were removed through the vagina. The vaginal cuff was closed vaginally using 0-Vicryl in  a running locking stitch. This  was tested for integrity using the surgeon's finger. After a change of gloves, the pneumoperitoneum was recreated and surgical site inspected, and found to be hemostatic. Bilateral ureters were visualized vermiuclating. No intraoperative injury to surrounding organs was noted. The abdomen was desufflated and all instruments were then removed.   The attention was turned to the bladder.  A 70-degree cystoscope was inserted into the urethra after the foley was removed.  The bladder was inflated with 300cc of normal saline, and inspection of the bladder wall was performed. No Hunner's Ulcers or bladder wall abnormalities were noted.  Bilateral ureteral jets were observed.  The cystoscope was removed.  All skin incisions were closed with 4-0 monocryl and covered with surgical glue. The patient tolerated the procedures well.  All instruments, needles, and sponge counts were correct x 2. The patient was taken to the recovery room in stable condition.   ---- Larey Days, MD Attending Obstetrician and [facility]

## 2017-05-23 NOTE — Anesthesia Procedure Notes (Signed)
Procedure Name: Intubation Date/Time: 05/23/2017 7:42 AM Performed by: Silvana Newness Pre-anesthesia Checklist: Patient identified, Emergency Drugs available, Suction available, Patient being monitored and Timeout performed Patient Re-evaluated:Patient Re-evaluated prior to inductionOxygen Delivery Method: Circle system utilized Preoxygenation: Pre-oxygenation with 100% oxygen Intubation Type: IV induction Ventilation: Mask ventilation without difficulty Laryngoscope Size: Mac and 3 Grade View: Grade I Tube type: Oral Tube size: 7.0 mm Airway Equipment and Method: Stylet Placement Confirmation: ETT inserted through vocal cords under direct vision,  positive ETCO2 and breath sounds checked- equal and bilateral Secured at: 19 cm Tube secured with: Tape Dental Injury: Teeth and Oropharynx as per pre-operative assessment

## 2017-05-23 NOTE — Anesthesia Post-op Follow-up Note (Cosign Needed)
Anesthesia QCDR form completed.        

## 2017-05-23 NOTE — Transfer of Care (Signed)
Immediate Anesthesia Transfer of Care Note  Patient: Stacy Mckinney  Procedure(s) Performed: Procedure(s): HYSTERECTOMY TOTAL LAPAROSCOPIC (N/A) LAPAROSCOPIC UNILATERAL SALPINGO OOPHORECTOMY (Right) LAPAROSCOPIC UNILATERAL SALPINGECTOMY (Left) CYSTOSCOPY  Patient Location: PACU  Anesthesia Type:General  Level of Consciousness: drowsy and patient cooperative  Airway & Oxygen Therapy: Patient Spontanous Breathing and Patient connected to face mask oxygen  Post-op Assessment: Report given to RN, Post -op Vital signs reviewed and stable and Patient moving all extremities X 4  Post vital signs: Reviewed and stable  Last Vitals:  Vitals:   05/23/17 0636  BP: (!) 126/101  Pulse: 88  Resp: 16  Temp: 36.6 C    Last Pain:  Vitals:   05/23/17 0636  TempSrc: Tympanic  PainSc: 3          Complications: No apparent anesthesia complications

## 2017-05-23 NOTE — Anesthesia Postprocedure Evaluation (Signed)
Anesthesia Post Note  Patient: Stacy Mckinney  Procedure(s) Performed: Procedure(s) (LRB): HYSTERECTOMY TOTAL LAPAROSCOPIC (N/A) LAPAROSCOPIC UNILATERAL SALPINGO OOPHORECTOMY (Right) LAPAROSCOPIC UNILATERAL SALPINGECTOMY (Left) CYSTOSCOPY  Patient location during evaluation: PACU Anesthesia Type: General Level of consciousness: awake and alert Pain management: pain level controlled Vital Signs Assessment: post-procedure vital signs reviewed and stable Respiratory status: spontaneous breathing, nonlabored ventilation, respiratory function stable and patient connected to nasal cannula oxygen Cardiovascular status: blood pressure returned to baseline and stable Postop Assessment: no signs of nausea or vomiting Anesthetic complications: no     Last Vitals:  Vitals:   05/23/17 1050 05/23/17 1105  BP: 122/84 117/78  Pulse: 76 84  Resp: 14 19  Temp:  36.5 C    Last Pain:  Vitals:   05/23/17 1105  TempSrc:   PainSc: 5                  Precious Haws Piscitello

## 2017-05-23 NOTE — OR Nursing (Signed)
Pt already has oxycodone and gabapentin MD prescribed prior to surgery.  Also aware MD wants her to take ibuprofen and tylenol per d/c instruction sheet.

## 2017-05-24 ENCOUNTER — Encounter: Payer: Self-pay | Admitting: Obstetrics & Gynecology

## 2017-05-24 LAB — SURGICAL PATHOLOGY

## 2017-07-14 ENCOUNTER — Ambulatory Visit: Payer: 59 | Attending: Obstetrics & Gynecology | Admitting: Physical Therapy

## 2017-07-14 ENCOUNTER — Encounter: Payer: Self-pay | Admitting: Physical Therapy

## 2017-07-14 DIAGNOSIS — M6281 Muscle weakness (generalized): Secondary | ICD-10-CM | POA: Diagnosis present

## 2017-07-14 DIAGNOSIS — M533 Sacrococcygeal disorders, not elsewhere classified: Secondary | ICD-10-CM | POA: Diagnosis present

## 2017-07-14 NOTE — Patient Instructions (Addendum)
Stand to floor   Longer stance, hip width apart ( lunge position)  50% weight in both legs not just the front  Knee above ankle   Mini lunges to practice each day , hand at counter 10 reps   ________  .deep core level 1-2  ( handout)

## 2017-07-15 NOTE — Therapy (Signed)
[place] [facility] [address] [place], [place], [address] Phone: [phone]   Fax:  [phone]  Physical Therapy Evaluation  Patient Details  Name: Stacy Mckinney MRN: [id number] Date of Birth: 1971-07-22 Referring Provider: Dr. Vikki Ports Ward   Encounter Date: 07/14/2017      PT End of Session - 07/14/17 1539    Visit Number 1   Number of Visits 12   Date for PT Re-Evaluation 09/22/17   PT Start Time 6384   PT Stop Time 1635   PT Time Calculation (min) 65 min      Past Medical History:  Diagnosis Date  . Abdominal pain   . Anxiety   . Bipolar disorder ([place])   . Bladder disorder   . FHx: cardiovascular disease   . H/O ovarian cystectomy   . Heart murmur   . Hyperlipidemia   . Nausea   . Smoker   . Tobacco use disorder     Past Surgical History:  Procedure Laterality Date  . CYSTOSCOPY  05/23/2017   Procedure: CYSTOSCOPY;  Surgeon: Ward, Honor Loh, MD;  Location: [facility] ORS;  Service: Gynecology;;  . ELBOW SURGERY     left elbow  . LAPAROSCOPIC APPENDECTOMY  10/23/2012   Procedure: APPENDECTOMY LAPAROSCOPIC;  Surgeon: Earnstine Regal, MD;  Location: [facility] ORS;  Service: General;  Laterality: N/A;  . LAPAROSCOPIC HYSTERECTOMY N/A 05/23/2017   Procedure: HYSTERECTOMY TOTAL LAPAROSCOPIC;  Surgeon: Ward, Honor Loh, MD;  Location: [facility] ORS;  Service: Gynecology;  Laterality: N/A;  . LAPAROSCOPIC OVARIAN CYSTECTOMY    . LAPAROSCOPIC UNILATERAL SALPINGECTOMY Left 05/23/2017   Procedure: LAPAROSCOPIC UNILATERAL SALPINGECTOMY;  Surgeon: Ward, Honor Loh, MD;  Location: [facility] ORS;  Service: Gynecology;  Laterality: Left;  . LAPAROSCOPIC UNILATERAL SALPINGO OOPHERECTOMY Right 05/23/2017   Procedure: LAPAROSCOPIC UNILATERAL SALPINGO OOPHORECTOMY;  Surgeon: Ward, Honor Loh, MD;  Location: [facility] ORS;  Service: Gynecology;  Laterality: Right;  . LEEP    . RHINOPLASTY  age 46   due to broken nose  . TONSILLECTOMY     age 46    There were no  vitals filed for this visit.       Subjective Assessment - 07/14/17 1540    Subjective On New Year's Eve 2018, pt experienced back pain after reaching for a water bottle. Pt went to the ER due to intense back spasms and was informed that she "hurt her back".  Pt underwent PT for 4 visits and was informed by her PT that her pain was not coming from her back. Her massage therapist and chiropractor recommended recommende her to see her gyunecologist. Her GYN MD did an [place] which showed a large R ovarian cyst. Pt had a laproscopic hysterectomy 05/23/17 and left the left ovary intact. The pain originally presented in the R back but it is gone now. Currently, pt notices "stitch" or a sensation "of getting the wind knocked out of her" in the R   abdominal mm. Pt has had four surgeries the area ( ovary cyst R x 2, appendectomy, hysterectomy).  Denied constipation.  Walking a football field because fatigued and feeling the R side-pain. Stair climbing is difficulty due to the pain. At worst, 6-7/10. At best 3/10. Denied radiating.    2) urinary leakage coughing.     Pertinent History Pt has a fall onto buttocks 1 weeks ago with increased pain that resolved with Gabapentin and rest. She had missed a step.  GYN HX: no pregnancies. Denied digestives  issues. No FHx of CA. Hx of sit ups and crunches.    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             [facility] PT Assessment - 07/14/17 1604      Assessment   Medical Diagnosis s/p hysterectomy    Referring Provider Dr. Vikki Ports Ward      Precautions   Precautions None     Restrictions   Weight Bearing Restrictions No     Balance Screen   Has the patient fallen in the past 6 months Yes   How many times? 1  missed a step and fell on buttock    Has the patient had a decrease in activity level because of a fear of falling?  No   Is the patient reluctant to leave their home because  of a fear of falling?  No     Lunges   Comments anterior COM, poor knee alignment with LOB      AROM   Overall AROM Comments sidebend R 50cm digit III to floor, sidebend L 51.5 cm   rotation B ~25% ( post Tx: 45%)      Palpation   SI assessment  R ASIS more inferior    Palpation comment FADDIR and hip flex with reproduction of R sided pain, headlift in supine reproduced the pain   lowering leg and release of palpation over R side trunk p!            Objective measurements completed on examination: See above findings.          [place] Adult PT Treatment/Exercise - 07/14/17 1629      Neuro Re-ed    Neuro Re-ed Details  see pt instructions      Manual Therapy   Manual therapy comments R rotational mob, MWM with PA mob along R lateral sacrum withclams                 PT Education - 07/14/17 1634    Education provided Yes   Education Details POC , anatomy, phsyiology, goals, HEP   Person(s) Educated Patient   Methods Explanation;Demonstration;Tactile cues;Verbal cues;Handout   Comprehension Returned demonstration;Verbalized understanding             PT Long Term Goals - 07/14/17 1552      PT LONG TERM GOAL #1   Title Pt will report no pain with driving across 20 min in order to commute    Time 12   Period Weeks   Status New   Target Date 08/04/17     PT LONG TERM GOAL #2   Title Pt have decreased scar restrictions and be able to achieve increased sidebend L from 51.cm digit III to floor to 50 cm and rotation from 25% to > 45%  in order to stoop for training dogs    Time 12   Period Weeks   Status New     PT LONG TERM GOAL #3   Title Pt will be able to walk 2 hours, stoop with proper body mechanics, and twist body with co-activation of deep core mm in order to train dogs    Time 12   Period Weeks   Status New   Target Date 09/22/17     PT LONG TERM GOAL #4   Title Pt will demo no lumbopelvic perturbations with deep core 2-3 for 5 reps in  order to progress to  upright deep core strengthening to resist the pull of his 70 lbs     Time 12   Period Weeks   Status New   Target Date 08/18/17     PT LONG TERM GOAL #5   Title Pt will be able to demo pulling 70 lbs with the cable column in order to minimize risk for injury when walking her dog    Time 12   Period Weeks   Status New   Target Date 09/22/17     Additional Long Term Goals   Additional Long Term Goals Yes     PT LONG TERM GOAL #6   Title Pt will decrease her [place] score from 61% to < 50% in order to return to ADLS and improve QOL   Time 12   Period Weeks   Status New                Plan - 07/15/17 0800    Clinical Impression Statement Pt is a 46 yo female who reports R flank/abdominal pain following hysterectomy 2 months ago in addition to stress urinary incontinence. Pt has had a total of 4 abdominal surgeries including the hysterectomy. This pain interferes with her ability to climb stairs,walk long distances, bend, and train her dogs. Pt's clinical presentations include pelvic obliquity, sacroiliac hypomobility, fascial restrictions, pain with hip flexion, hip flexion/adduction/IR, lower extremity and deep core weakness, spinal mobility, poor body mechanic  Following Tx today, pt demo'd a more symmetrically aligned pelvic girdle and increased spinal mobility. Plan to assess pelvic floor at a future session.      History and Personal Factors relevant to plan of care: multiple abdominal surgeries    Clinical Presentation Evolving   Clinical Decision Making Low   Rehab Potential Good   PT Frequency 1x / week   PT Duration 12 weeks   PT Treatment/Interventions Neuromuscular re-education;Cognitive remediation;Functional mobility training;Stair training;Gait training;Moist Heat;Aquatic Therapy;Therapeutic activities;Patient/family education;Therapeutic exercise;Manual techniques;Scar mobilization;Taping   Consulted and Agree with Plan of Care Patient       Patient will benefit from skilled therapeutic intervention in order to improve the following deficits and impairments:  Increased fascial restricitons, Postural dysfunction, Pain, Improper body mechanics, Decreased coordination, Decreased activity tolerance, Decreased safety awareness, Decreased strength, Decreased balance, Decreased endurance, Increased muscle spasms, Difficulty walking, Impaired sensation, Decreased scar mobility, Decreased range of motion, Decreased mobility  Visit Diagnosis: Muscle weakness (generalized)  Sacrococcygeal disorders, not elsewhere classified     Problem List Patient Active Problem List   Diagnosis Date Noted  . History of abnormal cervical Pap smear 04/01/2014  . Tobacco use disorder 01/25/2014  . Herpes labialis 08/04/2012  . Mood disorder ([place]) 08/04/2012  . Hyperlipidemia with target low density lipoprotein (LDL) cholesterol less than 100 mg/dL 10/07/2011  . Family history of heart disease in female family member before age 43 10/07/2011    Jerl Mina ,PT, DPT, E-RYT  07/15/2017, 8:57 AM  [facility] [address] [place], [place], [address] Phone: [phone]   Fax:  [phone]  Name: Stacy Mckinney MRN: [id number] Date of Birth: Apr 28, 1971

## 2017-07-25 ENCOUNTER — Ambulatory Visit: Payer: 59 | Admitting: Physical Therapy

## 2017-07-25 DIAGNOSIS — M6281 Muscle weakness (generalized): Secondary | ICD-10-CM

## 2017-07-25 DIAGNOSIS — M533 Sacrococcygeal disorders, not elsewhere classified: Secondary | ICD-10-CM

## 2017-07-25 NOTE — Therapy (Signed)
[place] MAIN [facility] SERVICES [address] [place], [place], [address] Phone: [phone]   Fax:  [phone]  Physical Therapy Treatment  Patient Details  Name: Stacy Mckinney MRN: [id number] Date of Birth: 1971-11-19 Referring Provider: Dr. Vikki Ports Ward   Encounter Date: 07/25/2017      PT End of Session - 07/25/17 0915    Visit Number 2   Number of Visits 12   Date for PT Re-Evaluation 09/22/17   PT Start Time 0910   PT Stop Time 1005   PT Time Calculation (min) 55 min   Activity Tolerance Patient tolerated treatment well;No increased pain   Behavior During Therapy WFL for tasks assessed/performed      Past Medical History:  Diagnosis Date  . Abdominal pain   . Anxiety   . Bipolar disorder ([place])   . Bladder disorder   . FHx: cardiovascular disease   . H/O ovarian cystectomy   . Heart murmur   . Hyperlipidemia   . Nausea   . Smoker   . Tobacco use disorder     Past Surgical History:  Procedure Laterality Date  . CYSTOSCOPY  05/23/2017   Procedure: CYSTOSCOPY;  Surgeon: Ward, Honor Loh, MD;  Location: [facility] ORS;  Service: Gynecology;;  . ELBOW SURGERY     left elbow  . LAPAROSCOPIC APPENDECTOMY  10/23/2012   Procedure: APPENDECTOMY LAPAROSCOPIC;  Surgeon: Earnstine Regal, MD;  Location: [facility];  Service: General;  Laterality: N/A;  . LAPAROSCOPIC HYSTERECTOMY N/A 05/23/2017   Procedure: HYSTERECTOMY TOTAL LAPAROSCOPIC;  Surgeon: Ward, Honor Loh, MD;  Location: [facility] ORS;  Service: Gynecology;  Laterality: N/A;  . LAPAROSCOPIC OVARIAN CYSTECTOMY    . LAPAROSCOPIC UNILATERAL SALPINGECTOMY Left 05/23/2017   Procedure: LAPAROSCOPIC UNILATERAL SALPINGECTOMY;  Surgeon: Ward, Honor Loh, MD;  Location: [facility] ORS;  Service: Gynecology;  Laterality: Left;  . LAPAROSCOPIC UNILATERAL SALPINGO OOPHERECTOMY Right 05/23/2017   Procedure: LAPAROSCOPIC UNILATERAL SALPINGO OOPHORECTOMY;  Surgeon: Ward, Honor Loh, MD;  Location: [facility] ORS;  Service:  Gynecology;  Laterality: Right;  . LEEP    . RHINOPLASTY  age 26   due to broken nose  . TONSILLECTOMY     age 89    There were no vitals filed for this visit.      Subjective Assessment - 07/25/17 0912    Subjective Pt reports her pain has improved 10-20% and it no longer constant. When she goes to the bathroom, it does not hurt.  When the "grabbing pain" came on after walking increased mileage to 1 mile, the pain was more intense and lasted longer.  The pain doubled her over and she layed on her side to relax.  Pt learned to not increase her mileage so quickly. Pt will stick with her 1/2 mile and only increase 30 steps next time.  Pt also noticed more pain when walking her dogs.     Pertinent History Pt has a fall onto buttocks 1 weeks ago with increased pain that resolved with Gabapentin and rest. She had missed a step.  GYN HX: no pregnancies. Denied digestives issues. No FHx of CA. Hx of sit ups and crunches.    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             [facility] PT Assessment - 07/25/17 0917      AROM   Overall AROM Comments sidebend B 53 cm, rotation still limited ~25 % ,  hip flexion R pain at 130deg, pain at 125 deg LLE      Palpation   Spinal mobility increased hypomobility L1 with referred pain to R pain at medial iliac crest.  mm tensions at interspinal mm R > L at thoracic level      SI assessment  ASIS symmetrical                   Pelvic Floor Special Questions - 07/25/17 1016    Pelvic Floor Internal Exam pt was provided details of the exam, pt consented verbally without contraindication     Exam Type Vaginal   Palpation increased tensions/tenderness at R ischiococcgyeus, B obt int , L iliococcygeus    Strength # of reps 1  abdominal straining, dyscoordination pelvic floor            [facility] Adult PT Treatment/Exercise - 07/25/17 0001      Neuro Re-ed    Neuro Re-ed  Details  see pt instructions, cued for less effort with pelvic tilt, correct chest breathing       Modalities   Modalities Moist Heat     Moist Heat Therapy   Number Minutes Moist Heat 5 Minutes   Moist Heat Location  Thoracic Spine     Manual Therapy   Internal Pelvic Floor MET at problem areas noted in assessment, thiele massage to faciliate coordination of pelvic floor mm                 PT Education - 07/25/17 1033    Education provided Yes   Education Details HEP   Person(s) Educated Patient   Methods Explanation   Comprehension Verbalized understanding;Returned demonstration;Verbal cues required;Tactile cues required;Need further instruction             PT Long Term Goals - 07/14/17 1552      PT LONG TERM GOAL #1   Title Pt will report no pain with driving across 20 min in order to commute    Time 12   Period Weeks   Status New   Target Date 08/04/17     PT LONG TERM GOAL #2   Title Pt have decreased scar restrictions and be able to achieve increased sidebend L from 51.cm digit III to floor to 50 cm and rotation from 25% to > 45%  in order to stoop for training dogs    Time 12   Period Weeks   Status New     PT LONG TERM GOAL #3   Title Pt will be able to walk 2 hours, stoop with proper body mechanics, and twist body with co-activation of deep core mm in order to train dogs    Time 12   Period Weeks   Status New   Target Date 09/22/17     PT LONG TERM GOAL #4   Title Pt will demo no lumbopelvic perturbations with deep core 2-3 for 5 reps in order to progress to upright deep core strengthening to resist the pull of his 70 lbs     Time 12   Period Weeks   Status New   Target Date 08/18/17     PT LONG TERM GOAL #5   Title Pt will be able to demo pulling 70 lbs with the cable column in order to minimize risk for injury when walking her dog    Time 12   Period Weeks   Status New   Target Date 09/22/17     Additional Long  Term Goals    Additional Long Term Goals Yes     PT LONG TERM GOAL #6   Title Pt will decrease her [place] score from 61% to < 50% in order to return to ADLS and improve QOL   Time 12   Period Weeks   Status New               Plan - 07/25/17 1035    Clinical Impression Statement Pt showed improved trunk rotation and improved diaphragmatic excursion (posterior) post Tx today. Pt required intravaginal Tx to minimize pelvic floor mm tensions/ tenderness.  Pt also showed less dyscoordination of pelvic floor ( abdominal strain) and reported less "pulling" at her R abdominal mm following training.  Pt will continue to benefit from skilled PT.     Rehab Potential Good   PT Frequency 1x / week   PT Duration 12 weeks   PT Treatment/Interventions Neuromuscular re-education;Cognitive remediation;Functional mobility training;Stair training;Gait training;Moist Heat;Aquatic Therapy;Therapeutic activities;Patient/family education;Therapeutic exercise;Manual techniques;Scar mobilization;Taping   Consulted and Agree with Plan of Care Patient      Patient will benefit from skilled therapeutic intervention in order to improve the following deficits and impairments:  Increased fascial restricitons, Postural dysfunction, Pain, Improper body mechanics, Decreased coordination, Decreased activity tolerance, Decreased safety awareness, Decreased strength, Decreased balance, Decreased endurance, Increased muscle spasms, Difficulty walking, Impaired sensation, Decreased scar mobility, Decreased range of motion, Decreased mobility  Visit Diagnosis: Muscle weakness (generalized)  Sacrococcygeal disorders, not elsewhere classified     Problem List Patient Active Problem List   Diagnosis Date Noted  . History of abnormal cervical Pap smear 04/01/2014  . Tobacco use disorder 01/25/2014  . Herpes labialis 08/04/2012  . Mood disorder ([place]) 08/04/2012  . Hyperlipidemia with target low density lipoprotein (LDL) cholesterol  less than 100 mg/dL 10/07/2011  . Family history of heart disease in female family member before age 19 10/07/2011    Jerl Mina ,PT, DPT, E-RYT  07/25/2017, 10:39 AM  [facility] [facility] [address] [place], [place], [address] Phone: [phone]   Fax:  [phone]  Name: Stacy Mckinney MRN: [id number] Date of Birth: 1971/09/06

## 2017-07-25 NOTE — Patient Instructions (Signed)
Deep core level 1 only  Open book  ( illustrations)

## 2017-08-08 ENCOUNTER — Ambulatory Visit: Payer: 59 | Admitting: Physical Therapy

## 2017-08-12 ENCOUNTER — Ambulatory Visit: Payer: 59 | Admitting: Physical Therapy

## 2017-08-16 ENCOUNTER — Encounter: Payer: 59 | Admitting: Physical Therapy

## 2017-08-26 ENCOUNTER — Ambulatory Visit: Payer: 59 | Attending: Obstetrics & Gynecology | Admitting: Physical Therapy

## 2017-08-26 DIAGNOSIS — M533 Sacrococcygeal disorders, not elsewhere classified: Secondary | ICD-10-CM

## 2017-08-26 DIAGNOSIS — M6281 Muscle weakness (generalized): Secondary | ICD-10-CM

## 2017-08-26 NOTE — Patient Instructions (Addendum)
Strengthening:   band under heels while laying on back w/ knees bent "W" exercise  10 reps x 2 sets   Band is placed under feet, knees bent, feet are hip width apart Hold band with thumbs point out, keep upper arm and elbow touching the bed the whole time  - inhale and then exhale pull bands by bending elbows hands move in a "w"  (feel shoulder blades squeezing)       Multifidis twist: Stand with red band at doorknob  Perpendicular to the door   starting position sumo stance, shoulder blades roll back and down like squeezing a pencil under armpit    ________  Stretches : (Cuing provided for proper alignment) ROM of each joint wall stretch  Thread onto the thigh   Mini squat-figure 4 (piriformis)   hip flexor stretch, arm reaches up     hip flexor twist

## 2017-08-26 NOTE — Therapy (Signed)
[place] MAIN [facility] SERVICES [address] [place], [place], [address] Phone: [phone]   Fax:  [phone]  Physical Therapy Treatment  Patient Details  Name: Stacy Mckinney MRN: [id number] Date of Birth: 08-22-1971 Referring Provider: Dr. Vikki Ports Ward   Encounter Date: 08/26/2017      PT End of Session - 08/26/17 0959    Visit Number 3   Number of Visits 12   Date for PT Re-Evaluation 09/22/17   PT Start Time 0900   PT Stop Time 1005   PT Time Calculation (min) 65 min   Activity Tolerance Patient tolerated treatment well;No increased pain   Behavior During Therapy WFL for tasks assessed/performed      Past Medical History:  Diagnosis Date  . Abdominal pain   . Anxiety   . Bipolar disorder ([place])   . Bladder disorder   . FHx: cardiovascular disease   . H/O ovarian cystectomy   . Heart murmur   . Hyperlipidemia   . Nausea   . Smoker   . Tobacco use disorder     Past Surgical History:  Procedure Laterality Date  . CYSTOSCOPY  05/23/2017   Procedure: CYSTOSCOPY;  Surgeon: Ward, Honor Loh, MD;  Location: [facility]S;  Service: Gynecology;;  . ELBOW SURGERY     left elbow  . LAPAROSCOPIC APPENDECTOMY  10/23/2012   Procedure: APPENDECTOMY LAPAROSCOPIC;  Surgeon: Earnstine Regal, MD;  Location: [facility];  Service: General;  Laterality: N/A;  . LAPAROSCOPIC HYSTERECTOMY N/A 05/23/2017   Procedure: HYSTERECTOMY TOTAL LAPAROSCOPIC;  Surgeon: Ward, Honor Loh, MD;  Location: [facility];  Service: Gynecology;  Laterality: N/A;  . LAPAROSCOPIC OVARIAN CYSTECTOMY    . LAPAROSCOPIC UNILATERAL SALPINGECTOMY Left 05/23/2017   Procedure: LAPAROSCOPIC UNILATERAL SALPINGECTOMY;  Surgeon: Ward, Honor Loh, MD;  Location: [facility];  Service: Gynecology;  Laterality: Left;  . LAPAROSCOPIC UNILATERAL SALPINGO OOPHERECTOMY Right 05/23/2017   Procedure: LAPAROSCOPIC UNILATERAL SALPINGO OOPHORECTOMY;  Surgeon: Ward, Honor Loh, MD;  Location: [facility];  Service:  Gynecology;  Laterality: Right;  . LEEP    . RHINOPLASTY  age 46   due to broken nose  . TONSILLECTOMY     age 46    There were no vitals filed for this visit.      Subjective Assessment - 08/26/17 0912    Subjective Pt reported she has returned to walking her small dog for less than milewithout pain. Pt reported the R abdominal pain with twisting R when wiping bowel movements.    Pertinent History Pt has a fall onto buttocks 1 weeks ago with increased pain that resolved with Gabapentin and rest. She had missed a step.  GYN HX: no pregnancies. Denied digestives issues. No FHx of CA. Hx of sit ups and crunches.    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                       Pelvic Floor Special Questions - 08/26/17 0953    Pelvic Floor Internal Exam pt was provided details of the exam, pt consented verbally without contraindication     Exam Type Vaginal   Palpation increased tensions/tenderness at R ischiococcgyeus, R obt int , R iliococcygeus    Strength # of reps --  proper lengthening            [facility] Adult PT Treatment/Exercise - 08/26/17 5035      Neuro  Re-ed    Neuro Re-ed Details  see pt instructions     Exercises   Exercises --  see pt instruction     Manual Therapy   Internal Pelvic Floor thiele massage at problem areas indicated in assessment                PT Education - 08/26/17 0914    Education provided Yes   Education Details HEP   Person(s) Educated Patient   Methods Explanation;Demonstration;Tactile cues;Handout;Verbal cues   Comprehension Verbalized understanding;Returned demonstration             PT Long Term Goals - 07/14/17 1552      PT LONG TERM GOAL #1   Title Pt will report no pain with driving across 20 min in order to commute    Time 12   Period Weeks   Status New   Target Date 08/04/17     PT LONG TERM GOAL #2   Title Pt have  decreased scar restrictions and be able to achieve increased sidebend L from 51.cm digit III to floor to 50 cm and rotation from 25% to > 45%  in order to stoop for training dogs    Time 12   Period Weeks   Status New     PT LONG TERM GOAL #3   Title Pt will be able to walk 2 hours, stoop with proper body mechanics, and twist body with co-activation of deep core mm in order to train dogs    Time 12   Period Weeks   Status New   Target Date 09/22/17     PT LONG TERM GOAL #4   Title Pt will demo no lumbopelvic perturbations with deep core 2-3 for 5 reps in order to progress to upright deep core strengthening to resist the pull of his 70 lbs     Time 12   Period Weeks   Status New   Target Date 08/18/17     PT LONG TERM GOAL #5   Title Pt will be able to demo pulling 70 lbs with the cable column in order to minimize risk for injury when walking her dog    Time 12   Period Weeks   Status New   Target Date 09/22/17     Additional Long Term Goals   Additional Long Term Goals Yes     PT LONG TERM GOAL #6   Title Pt will decrease her [place] score from 61% to < 50% in order to return to ADLS and improve QOL   Time 12   Period Weeks   Status New               Plan - 08/26/17 1131    Clinical Impression Statement Pt demo'd less pelvic floor mm tensions compared last session. Pt continued to show decreased overactivity and achieved proper coordination Post Tx. Pt advanced to thoracolumbar and multifidis strengthening. Added walking stretches to minimize tightness of mm with increased walking and prepping for her upcoming walking event. Modified HEP when pt reported abdominal pain by alternating strengthening exericse with lower reps and deep core exercise which she finds to be relaxing.  Pt had no c/o abdominal pain following modifications. Pt reported feeling fatigued after today's session.Pt continues to benefit from skilled PT.    Rehab Potential Good   PT Frequency 1x / week   PT  Duration 12 weeks   PT Treatment/Interventions Neuromuscular re-education;Cognitive remediation;Functional mobility training;Stair training;Gait training;Moist Heat;Aquatic Therapy;Therapeutic activities;Patient/family  education;Therapeutic exercise;Manual techniques;Scar mobilization;Taping   Consulted and Agree with Plan of Care Patient      Patient will benefit from skilled therapeutic intervention in order to improve the following deficits and impairments:  Increased fascial restricitons, Postural dysfunction, Pain, Improper body mechanics, Decreased coordination, Decreased activity tolerance, Decreased safety awareness, Decreased strength, Decreased balance, Decreased endurance, Increased muscle spasms, Difficulty walking, Impaired sensation, Decreased scar mobility, Decreased range of motion, Decreased mobility  Visit Diagnosis: Muscle weakness (generalized)  Sacrococcygeal disorders, not elsewhere classified     Problem List Patient Active Problem List   Diagnosis Date Noted  . History of abnormal cervical Pap smear 04/01/2014  . Tobacco use disorder 01/25/2014  . Herpes labialis 08/04/2012  . Mood disorder ([place]) 08/04/2012  . Hyperlipidemia with target low density lipoprotein (LDL) cholesterol less than 100 mg/dL 10/07/2011  . Family history of heart disease in female family member before age 2 10/07/2011    Jerl Mina ,PT, DPT, E-RYT  08/26/2017, 11:47 AM  [facility] [address] [place], [place], [address] Phone: [phone]   Fax:  [phone]  Name: Bayyinah Dukeman MRN: [id number] Date of Birth: 05/20/1971

## 2017-08-30 ENCOUNTER — Encounter: Payer: 59 | Admitting: Physical Therapy

## 2017-09-01 ENCOUNTER — Encounter: Payer: 59 | Admitting: Physical Therapy

## 2017-09-08 ENCOUNTER — Ambulatory Visit: Payer: 59 | Attending: Obstetrics & Gynecology | Admitting: Physical Therapy

## 2017-09-08 DIAGNOSIS — M25572 Pain in left ankle and joints of left foot: Secondary | ICD-10-CM | POA: Insufficient documentation

## 2017-09-08 DIAGNOSIS — M533 Sacrococcygeal disorders, not elsewhere classified: Secondary | ICD-10-CM | POA: Insufficient documentation

## 2017-09-08 DIAGNOSIS — M25571 Pain in right ankle and joints of right foot: Secondary | ICD-10-CM | POA: Insufficient documentation

## 2017-09-08 DIAGNOSIS — M6281 Muscle weakness (generalized): Secondary | ICD-10-CM | POA: Insufficient documentation

## 2017-09-08 NOTE — Therapy (Signed)
[place] MAIN [facility] SERVICES [address] [place], [place], [address] Phone: [phone]   Fax:  [phone]  Physical Therapy Treatment  Patient Details  Name: Stacy Mckinney MRN: [id number] Date of Birth: 1971-08-27 Referring Provider: Dr. Vikki Ports Ward   Encounter Date: 09/08/2017    Past Medical History:  Diagnosis Date  . Abdominal pain   . Anxiety   . Bipolar disorder ([place])   . Bladder disorder   . FHx: cardiovascular disease   . H/O ovarian cystectomy   . Heart murmur   . Hyperlipidemia   . Nausea   . Smoker   . Tobacco use disorder     Past Surgical History:  Procedure Laterality Date  . CYSTOSCOPY  05/23/2017   Procedure: CYSTOSCOPY;  Surgeon: Ward, Honor Loh, MD;  Location: [facility];  Service: Gynecology;;  . ELBOW SURGERY     left elbow  . LAPAROSCOPIC APPENDECTOMY  10/23/2012   Procedure: APPENDECTOMY LAPAROSCOPIC;  Surgeon: Earnstine Regal, MD;  Location: [facility];  Service: General;  Laterality: N/A;  . LAPAROSCOPIC HYSTERECTOMY N/A 05/23/2017   Procedure: HYSTERECTOMY TOTAL LAPAROSCOPIC;  Surgeon: Ward, Honor Loh, MD;  Location: [facility];  Service: Gynecology;  Laterality: N/A;  . LAPAROSCOPIC OVARIAN CYSTECTOMY    . LAPAROSCOPIC UNILATERAL SALPINGECTOMY Left 05/23/2017   Procedure: LAPAROSCOPIC UNILATERAL SALPINGECTOMY;  Surgeon: Ward, Honor Loh, MD;  Location: [facility];  Service: Gynecology;  Laterality: Left;  . LAPAROSCOPIC UNILATERAL SALPINGO OOPHERECTOMY Right 05/23/2017   Procedure: LAPAROSCOPIC UNILATERAL SALPINGO OOPHORECTOMY;  Surgeon: Ward, Honor Loh, MD;  Location: [facility];  Service: Gynecology;  Laterality: Right;  . LEEP    . RHINOPLASTY  age 46   due to broken nose  . TONSILLECTOMY     age 46    There were no vitals filed for this visit.      Subjective Assessment - 09/08/17 0858    Subjective Pt was able to walk in a dog walk for 1 mile and around a festival with casual walking without stopping and with  a dog on a leash. Pt had pain with uphills.  Pt has been walking less than a mile daily. Pt was able to shop for 2 hours in a city with a friend. Today pt  notic ed her feet are cramped and tight.  For the past 2 weeks, pt has not felt any pulli gpain at her abdomen. The only thing she has to be careful to not trigger the pulling sensation is wiping after bowel movements and digging for clothes out of the washer that is deep. Pt plans to put her dog into a scent tracking class, her dog weighs 67 lbs and on a leash that is 24ft long.  Pt will have to get up and down from the floor to feed her dog a treat 2-10 x across 500 yards in outside terrain. This class is once a week. Pt plans to do a half session for 90 min instead of 3 hours,     Pertinent History Pt has a fall onto buttocks 1 weeks ago with increased pain that resolved with Gabapentin and rest. She had missed a step.  GYN HX: no pregnancies. Denied digestives issues. No FHx of CA. Hx of sit ups and crunches.    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             [facility] PT Assessment - 09/08/17 0950  Observation/Other Assessments   Observations upright posture, more postural stability      Functional Tests   Functional tests --  reveiwed the multifidis twist      Squat   Comments anterior COM , knees over toes      Lunges   Comments increased anterior weight on front knee with poor alignment      Other:   Other/ Comments pronation of B feet, genu valgus B                      [facility] Adult PT Treatment/Exercise - 09/08/17 1254      Therapeutic Activites    Therapeutic Activities --  see pt instructions      Neuro Re-ed    Neuro Re-ed Details  see pt instructions                     PT Long Term Goals - 07/14/17 1552      PT LONG TERM GOAL #1   Title Pt will report no pain with driving across 20 min in order to  commute    Time 12   Period Weeks   Status New   Target Date 08/04/17     PT LONG TERM GOAL #2   Title Pt have decreased scar restrictions and be able to achieve increased sidebend L from 51.cm digit III to floor to 50 cm and rotation from 25% to > 45%  in order to stoop for training dogs    Time 12   Period Weeks   Status New     PT LONG TERM GOAL #3   Title Pt will be able to walk 2 hours, stoop with proper body mechanics, and twist body with co-activation of deep core mm in order to train dogs    Time 12   Period Weeks   Status New   Target Date 09/22/17     PT LONG TERM GOAL #4   Title Pt will demo no lumbopelvic perturbations with deep core 2-3 for 5 reps in order to progress to upright deep core strengthening to resist the pull of his 70 lbs     Time 12   Period Weeks   Status New   Target Date 08/18/17     PT LONG TERM GOAL #5   Title Pt will be able to demo pulling 70 lbs with the cable column in order to minimize risk for injury when walking her dog    Time 12   Period Weeks   Status New   Target Date 09/22/17     Additional Long Term Goals   Additional Long Term Goals Yes     PT LONG TERM GOAL #6   Title Pt will decrease her [place] score from 61% to < 50% in order to return to ADLS and improve QOL   Time 12   Period Weeks   Status New               Plan - 09/08/17 1012    Clinical Impression Statement Pt is building up her deepc ore strength and is returning to more activities.Progressed pt to functional strength training to minimize strain on joints, minimize risk for injuries, and prepare pt to building the endurance and strength needed to prepare her to train her dogs.  Pt continues to benefit from skilled PT. Plan to apply intergional dependence approach to address her pronated feet and genu valgus in balancing positions and  floor to stand position which are positions required in the training of her dogs.  Pt continues to benefit from skilled PT.       Rehab Potential Good   PT Frequency 1x / week   PT Duration 12 weeks   PT Treatment/Interventions Neuromuscular re-education;Cognitive remediation;Functional mobility training;Stair training;Gait training;Moist Heat;Aquatic Therapy;Therapeutic activities;Patient/family education;Therapeutic exercise;Manual techniques;Scar mobilization;Taping   Consulted and Agree with Plan of Care Patient      Patient will benefit from skilled therapeutic intervention in order to improve the following deficits and impairments:  Increased fascial restricitons, Postural dysfunction, Pain, Improper body mechanics, Decreased coordination, Decreased activity tolerance, Decreased safety awareness, Decreased strength, Decreased balance, Decreased endurance, Increased muscle spasms, Difficulty walking, Impaired sensation, Decreased scar mobility, Decreased range of motion, Decreased mobility  Visit Diagnosis: Muscle weakness (generalized)  Sacrococcygeal disorders, not elsewhere classified     Problem List Patient Active Problem List   Diagnosis Date Noted  . History of abnormal cervical Pap smear 04/01/2014  . Tobacco use disorder 01/25/2014  . Herpes labialis 08/04/2012  . Mood disorder ([place]) 08/04/2012  . Hyperlipidemia with target low density lipoprotein (LDL) cholesterol less than 100 mg/dL 10/07/2011  . Family history of heart disease in female family member before age 15 10/07/2011    Jerl Mina ,PT, DPT, E-RYT  09/08/2017, 12:57 PM  [place], [place], [address] Phone: [phone]   Fax:  [phone]  Name: Stacy Mckinney MRN: [id number] Date of Birth: April 30, 1971

## 2017-09-08 NOTE — Patient Instructions (Addendum)
Functional activities for strength and less strain   Feeding dogs ( lowering to floor with lunge to half kneeling)    Lunge with longer length, feet hip width, toes forward, front knee above ankle of front leg, back leg with toes tucked    Mini squat with third dog , knees behind toes  Exhale to rise   ** practice half way lunges to build strength)   ______  Getting clothes out of washer  1) Stool to stand on  2) Place wet clothes on the dryer  3) mini squat to put clothes in the dryer    ___  Pulling leash   Ski track position, one leg forward, other back  Back knee bend to drop pelvis and center low to counter the pull   Shoulder roll back, and sink weight into pelvis, feet instead of letting shoulder yank first    ___  Picking up light objects:  Leg balance       ___   Stretches:  Back:  Forward forward against counter or door   3 directions    Feet care :  Self -feet massage   Handshake : fingers between toes, moving ballmounds/toes back and forth several times while other hand anchors at arch. Do the same at the hind/mid foot.  Heel to toes upward to a letter Big Letter T strokes to spread ballmounds and toes, several times, pinch between webs of toes  Run finger tips along top of foot between long bones "comb between the bones"    Wiggle toes and spread them out when relaxing

## 2017-09-15 ENCOUNTER — Ambulatory Visit: Payer: 59 | Admitting: Physical Therapy

## 2017-09-20 ENCOUNTER — Ambulatory Visit: Payer: 59 | Admitting: Physical Therapy

## 2017-09-20 DIAGNOSIS — M6281 Muscle weakness (generalized): Secondary | ICD-10-CM

## 2017-09-20 DIAGNOSIS — M533 Sacrococcygeal disorders, not elsewhere classified: Secondary | ICD-10-CM

## 2017-09-20 DIAGNOSIS — M25571 Pain in right ankle and joints of right foot: Secondary | ICD-10-CM

## 2017-09-20 DIAGNOSIS — M25572 Pain in left ankle and joints of left foot: Secondary | ICD-10-CM

## 2017-09-20 NOTE — Patient Instructions (Addendum)
Standing marches with trunk rotation 2 mins  Know where you are putting feet down, spread toes   ________  Complimentary:  Hip flexor stretch with calf  5 reps    ________ Hamstring stretch:"   Seated  Or by wall with toe up , hip flexor stance  ________   Pelvic Side shifts, wide stance, knee tracks with front ball mounds  Spreading toes    ________  Less on heels, soft knees, soft butt, pelvic neutral not spilling forward to help with back    _________  Seated:  Ankle swipes out  Figure -4 and cross thighs  With twist  Hamstring with twist     _____________For travelling, use carry on with wheels for purse and laptop bag to minimize holding on shoulders                           Lifting with both hands in a lunge.... suitcase against body to the thigh and shift weight back to back leg     ___________   Stacy Mckinney 45 Degrees   Lying with hips and knees bent 45, one pillow between knees and ankles. Lift knee with exhale. Be sure pelvis does not roll backward. Do not arch back. Do 10 timex 2 on L  2 times per day.    http://ss.exer.us/75   Copyright  VHI. All rights reserved.

## 2017-09-21 NOTE — Therapy (Signed)
[place] [facility] SERVICES [address] [place], [place], [address] Phone: [phone]   Fax:  [phone]  Physical Therapy Treatment / Progress Note  Patient Details  Name: Stacy Mckinney MRN: [id number] Date of Birth: Dec 06, 1970 Referring Provider: Dr. Vikki Ports Ward   Encounter Date: 09/20/2017      PT End of Session - 09/20/17 1045    Visit Number 4   Number of Visits 12   Date for PT Re-Evaluation 09/22/17   PT Start Time 1000   PT Stop Time 1050   PT Time Calculation (min) 50 min   Activity Tolerance Patient tolerated treatment well;No increased pain   Behavior During Therapy WFL for tasks assessed/performed      Past Medical History:  Diagnosis Date  . Abdominal pain   . Anxiety   . Bipolar disorder ([place])   . Bladder disorder   . FHx: cardiovascular disease   . H/O ovarian cystectomy   . Heart murmur   . Hyperlipidemia   . Nausea   . Smoker   . Tobacco use disorder     Past Surgical History:  Procedure Laterality Date  . CYSTOSCOPY  05/23/2017   Procedure: CYSTOSCOPY;  Surgeon: Ward, Honor Loh, MD;  Location: [facility] ORS;  Service: Gynecology;;  . ELBOW SURGERY     left elbow  . LAPAROSCOPIC APPENDECTOMY  10/23/2012   Procedure: APPENDECTOMY LAPAROSCOPIC;  Surgeon: Earnstine Regal, MD;  Location: [facility] ORS;  Service: General;  Laterality: N/A;  . LAPAROSCOPIC HYSTERECTOMY N/A 05/23/2017   Procedure: HYSTERECTOMY TOTAL LAPAROSCOPIC;  Surgeon: Ward, Honor Loh, MD;  Location: [facility] ORS;  Service: Gynecology;  Laterality: N/A;  . LAPAROSCOPIC OVARIAN CYSTECTOMY    . LAPAROSCOPIC UNILATERAL SALPINGECTOMY Left 05/23/2017   Procedure: LAPAROSCOPIC UNILATERAL SALPINGECTOMY;  Surgeon: Ward, Honor Loh, MD;  Location: [facility] ORS;  Service: Gynecology;  Laterality: Left;  . LAPAROSCOPIC UNILATERAL SALPINGO OOPHERECTOMY Right 05/23/2017   Procedure: LAPAROSCOPIC UNILATERAL SALPINGO OOPHORECTOMY;  Surgeon: Ward, Honor Loh, MD;  Location: [facility]  ORS;  Service: Gynecology;  Laterality: Right;  . LEEP    . RHINOPLASTY  age 56   due to broken nose  . TONSILLECTOMY     age 58    There were no vitals filed for this visit.      Subjective Assessment - 09/20/17 1006    Subjective Pt found the modification for emptying washer and dryer to be helpful  . Pt reported she felt she overdid it at last session and felt sore for 3 days. Pt was able to picked up branches and small logs without increased pain in the abdominal area. Pt felt in her hamstrings and low back. pt has had feet problems in the past and she continues to feel feet pain. Pt reports she wore orthotics as a child for low arch and a foot that turned in.  Pt went to a music concert and stood for 2 hours after which, her B  feet hurt 8/10. Pt states when her feet hurt that bad, everything starts to hurt ( shoudlers, neck, legs, hip) .       Pertinent History Pt has a fall onto buttocks 1 weeks ago with increased pain that resolved with Gabapentin and rest. She had missed a step.  GYN HX: no pregnancies. Denied digestives issues. No FHx of CA. Hx of sit ups and crunches.    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  [facility] PT Assessment - 09/20/17 1021      Strength   Overall Strength Comments plantarflexion L 2reps 3/5, R 4 3/5,   eversion R 3/5, L 4/5, toe extrension B 5/5 , hip abd R 4-/5, L 2/5       Palpation   Palpation comment increased tightness at B plantar fascia      Ambulation/Gait   Gait Comments subtalar ankle inversion on stances, pronation                      [facility] Adult PT Treatment/Exercise - 09/20/17 1021      Therapeutic Activites    Therapeutic Activities --  see pt instructions      Exercises   Exercises --  see pt instruction                PT Education - 09/20/17 1044    Education provided Yes   Education Details HEP    Person(s)  Educated Patient   Methods Explanation;Demonstration;Tactile cues;Verbal cues;Handout   Comprehension Returned demonstration;Verbalized understanding             PT Long Term Goals - 09/20/17 1052      PT LONG TERM GOAL #1   Title Pt will report no pain with driving across 20 min in order to commute    Time 12   Period Weeks   Status Achieved     PT LONG TERM GOAL #2   Title Pt have decreased scar restrictions and be able to achieve increased sidebend L from 51.cm digit III to floor to 50 cm and rotation from 25% to > 45%  in order to stoop for training dogs    Time 12   Period Weeks   Status Achieved     PT LONG TERM GOAL #3   Title Pt will be able to walk 2 hours, stoop with proper body mechanics, and twist body with co-activation of deep core mm in order to train dogs    Time 12   Period Weeks   Status Achieved     PT LONG TERM GOAL #4   Title Pt will demo no lumbopelvic perturbations with deep core 2-3 for 5 reps in order to progress to upright deep core strengthening to resist the pull of his 70 lbs     Time 12   Period Weeks   Status Partially Met     PT LONG TERM GOAL #5   Title Pt will be able to demo pulling 70 lbs with the cable column in order to minimize risk for injury when walking her dog    Time 12   Period Weeks   Status On-going     Additional Long Term Goals   Additional Long Term Goals Yes     PT LONG TERM GOAL #6   Title Pt will decrease her [place] score from 61% to < 50% in order to return to ADLS and improve QOL ( 10:23:  30%)    Time 12   Period Weeks   Status Achieved     PT LONG TERM GOAL #7   Title Pt will demo increased L hip abduction 2/5 to 4-/5 and plantar flexion from 2 reps Grade 3/5 to 10 reps to transverse through uneven terrain with dogs     Time 12   Period Weeks   Status New     PT LONG TERM GOAL #8   Title Pt will demo no tightness at plantar  fascia B and more weightbearing in ballmound and heels in bilateral stance in  order to minimize feet pain and to have endurance with travels    Time 12   Period Weeks   Status New               Plan - 09/20/17 1059    Clinical Impression Statement Pt has achieved 4/8 goals and is progresing well towards her remaining goals. Pt's Pain disability Index decreased from 61% to 30% indicating improved function.  Pt no longer has pelvic pain and has returned to being able to stoop, bend over, rotate her trunk without abdominal pain. Pt also showed no pelvic floor mm  with return of pelvic floor ROM and coordination. Pt is working on strengthening to be able to train with dogs.   Added Dx for bilateral  feet and ankle pain  to recert to address her feet issues which impact  her ability to walk on uneven terrain when training with ehr dogs as well as travelling and standing for long hours.  Pt continues to benefit from skilled PT .   Rehab Potential Good   PT Frequency 1x / week   PT Duration 12 weeks   PT Treatment/Interventions Neuromuscular re-education;Cognitive remediation;Functional mobility training;Stair training;Gait training;Moist Heat;Aquatic Therapy;Therapeutic activities;Patient/family education;Therapeutic exercise;Manual techniques;Scar mobilization;Taping   Consulted and Agree with Plan of Care Patient      Patient will benefit from skilled therapeutic intervention in order to improve the following deficits and impairments:  Increased fascial restricitons, Postural dysfunction, Pain, Improper body mechanics, Decreased coordination, Decreased activity tolerance, Decreased safety awareness, Decreased strength, Decreased balance, Decreased endurance, Increased muscle spasms, Difficulty walking, Impaired sensation, Decreased scar mobility, Decreased range of motion, Decreased mobility  Visit Diagnosis: Muscle weakness (generalized)  Sacrococcygeal disorders, not elsewhere classified  Pain in left ankle and joints of left foot  Pain in right ankle and  joints of right foot     Problem List Patient Active Problem List   Diagnosis Date Noted  . History of abnormal cervical Pap smear 04/01/2014  . Tobacco use disorder 01/25/2014  . Herpes labialis 08/04/2012  . Mood disorder ([place]) 08/04/2012  . Hyperlipidemia with target low density lipoprotein (LDL) cholesterol less than 100 mg/dL 10/07/2011  . Family history of heart disease in female family member before age 24 10/07/2011    Jerl Mina ,PT, DPT, E-RYT  09/21/2017, 2:01 PM  [place] [facility] SERVICES [address] [place], [place], [address] Phone: [phone]   Fax:  [phone]  Name: Daijah Scrivens MRN: [id number] Date of Birth: 1971-01-31

## 2017-10-07 ENCOUNTER — Other Ambulatory Visit: Payer: Self-pay | Admitting: Family Medicine

## 2017-10-07 DIAGNOSIS — B001 Herpesviral vesicular dermatitis: Secondary | ICD-10-CM

## 2017-10-10 ENCOUNTER — Ambulatory Visit: Payer: 59 | Attending: Obstetrics & Gynecology | Admitting: Physical Therapy

## 2017-10-10 DIAGNOSIS — M25572 Pain in left ankle and joints of left foot: Secondary | ICD-10-CM | POA: Diagnosis present

## 2017-10-10 DIAGNOSIS — M25571 Pain in right ankle and joints of right foot: Secondary | ICD-10-CM | POA: Diagnosis present

## 2017-10-10 DIAGNOSIS — M533 Sacrococcygeal disorders, not elsewhere classified: Secondary | ICD-10-CM | POA: Diagnosis present

## 2017-10-10 DIAGNOSIS — M791 Myalgia, unspecified site: Secondary | ICD-10-CM | POA: Diagnosis not present

## 2017-10-10 DIAGNOSIS — M6281 Muscle weakness (generalized): Secondary | ICD-10-CM | POA: Diagnosis not present

## 2017-10-10 NOTE — Therapy (Addendum)
[place] [facility] SERVICES [address] [place], [place], [address] Phone: [phone]   Fax:  [phone]  Physical Therapy Treatment  Patient Details  Name: Stacy Mckinney MRN: [id number] Date of Birth: 12-Mar-1971 Referring Provider: Dr. Vikki Ports Ward    Encounter Date: 10/10/2017  PT End of Session - 10/10/17 0913    Visit Number  5    Number of Visits  12    Date for PT Re-Evaluation  12/14/17    PT Start Time  0810    PT Stop Time  0904    PT Time Calculation (min)  54 min    Activity Tolerance  Patient tolerated treatment well;No increased pain    Behavior During Therapy  WFL for tasks assessed/performed       Past Medical History:  Diagnosis Date  . Abdominal pain   . Anxiety   . Bipolar disorder ([place])   . Bladder disorder   . FHx: cardiovascular disease   . H/O ovarian cystectomy   . Heart murmur   . Hyperlipidemia   . Nausea   . Smoker   . Tobacco use disorder     Past Surgical History:  Procedure Laterality Date  . ELBOW SURGERY     left elbow  . LAPAROSCOPIC OVARIAN CYSTECTOMY    . LEEP    . RHINOPLASTY  age 19   due to broken nose  . TONSILLECTOMY     age 89    There were no vitals filed for this visit.  Subjective Assessment - 10/10/17 0815    Subjective  Pt is excited she is doing more. Pt returned from a trip handling her luggage. Pt had a little spotting. Pt climbed bleachers up 90 steps x 7-8 x  and down Pt also helped with picking up brush for a friend.  Pt feels the R abdominal pain which is like "spraining an ankle like an ache and a pull".  5-6/10.  Pt also reported leakage with sneezing/ coughing.     (Pended)     Pertinent History  Pt has a fall onto buttocks 1 weeks ago with increased pain that resolved with Gabapentin and rest. She had missed a step.  GYN HX: no pregnancies. Denied digestives issues. No FHx of CA. Hx of sit ups and crunches.     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          [facility] PT Assessment - 10/10/17 0909      Palpation   Palpation comment  tenderness at pubic symphysis B, lower abdomen ( decreased post Tx) . fascial tensions at mons pubis, lower ab .  No separation of abdominal and pubic symphysis.      Ambulation/Gait   Gait Comments  antalgic gait ( pre Tx) , more upright , swifter movement ( post Tx)                Pelvic Floor Special Questions - 10/10/17 0908    Pelvic Floor Internal Exam  pt was provided details of the exam, pt consented verbally without contraindication      Exam Type  Vaginal    Palpation  Increased tenderness/tightness at B 3rd layer 9-3 o clock     Strength # of reps  -- proper lengthening         [facility] Adult PT Treatment/Exercise - 10/10/17 0910      Moist Heat Therapy  Number Minutes Moist Heat  -- abdomenperineal wrapped in sheets15" during pelvic floor Tx       Manual Therapy   Internal Pelvic Floor  thiele massage at problem areas indicated in assessment             PT Education - 10/10/17 0913    Education provided  Yes    Education Details  HEP    Person(s) Educated  Patient    Methods  Explanation;Demonstration;Tactile cues;Verbal cues    Comprehension  Verbalized understanding;Returned demonstration          PT Long Term Goals - 09/20/17 1052      PT LONG TERM GOAL #1   Title  Pt will report no pain with driving across 20 min in order to commute     Time  12    Period  Weeks    Status  Achieved      PT LONG TERM GOAL #2   Title  Pt have decreased scar restrictions and be able to achieve increased sidebend L from 51.cm digit III to floor to 50 cm and rotation from 25% to > 45%  in order to stoop for training dogs     Time  12    Period  Weeks    Status  Achieved      PT LONG TERM GOAL #3   Title  Pt will be able to walk 2 hours, stoop with proper body mechanics, and twist body with co-activation of  deep core mm in order to train dogs     Time  12    Period  Weeks    Status  Achieved      PT LONG TERM GOAL #4   Title  Pt will demo no lumbopelvic perturbations with deep core 2-3 for 5 reps in order to progress to upright deep core strengthening to resist the pull of his 70 lbs      Time  12    Period  Weeks    Status  Partially Met      PT LONG TERM GOAL #5   Title  Pt will be able to demo pulling 70 lbs with the cable column in order to minimize risk for injury when walking her dog     Time  12    Period  Weeks    Status  On-going      Additional Long Term Goals   Additional Long Term Goals  Yes      PT LONG TERM GOAL #6   Title  Pt will decrease her [place] score from 61% to < 50% in order to return to ADLS and improve QOL ( 10:23:  30%)     Time  12    Period  Weeks    Status  Achieved      PT LONG TERM GOAL #7   Title  Pt will demo increased L hip abduction 2/5 to 4-/5 and plantar flexion from 2 reps Grade 3/5 to 10 reps to transverse through uneven terrain with dogs      Time  12    Period  Weeks    Status  New      PT LONG TERM GOAL #8   Title  Pt will demo no tightness at plantar fascia B and more weightbearing in ballmound and heels in bilateral stance in order to minimize feet pain and to have endurance with travels     Time  12    Period  Weeks  Status  New            Plan - 10/10/17 0913    Clinical Impression Statement  Pt is progressing well with increased activities with climbing, pulling, and bending in functional tasks. Pt demo'd slightly increased pelvic floor tensions and tenderness today in addition to tenderness/ fascial tensions along low abdominal mm.  Following Tx, these areas improved and she reoprted a decreased pain level in her R abdominal area from 5-6/10 to 2-3/10.  Pt also showed less antalgic gait end of session. Upgraded her resistance band exericse from yellow band to green band, discussion of modifcation of functional tasks to minimize  repeated bend or to light load with lifting, and reviewed pelvic floor relaxation HEP.  Discussed future sessions will be focused on strengthening global mm groups to continue advancing with strengthening to achieve remaining goals. Pt voiced understanding. PT advised pt to inform her gynecologist re: small of amount spotting that occured on her trip. Pt voiced understanding. Pt continues to benefit from skilled PT      Rehab Potential  Good    PT Frequency  1x / week    PT Duration  12 weeks    PT Treatment/Interventions  Neuromuscular re-education;Cognitive remediation;Functional mobility training;Stair training;Gait training;Moist Heat;Aquatic Therapy;Therapeutic activities;Patient/family education;Therapeutic exercise;Manual techniques;Scar mobilization;Taping    Consulted and Agree with Plan of Care  Patient       Patient will benefit from skilled therapeutic intervention in order to improve the following deficits and impairments:  Increased fascial restricitons, Postural dysfunction, Pain, Improper body mechanics, Decreased coordination, Decreased activity tolerance, Decreased safety awareness, Decreased strength, Decreased balance, Decreased endurance, Increased muscle spasms, Difficulty walking, Impaired sensation, Decreased scar mobility, Decreased range of motion, Decreased mobility  Visit Diagnosis: Muscle weakness (generalized)  Sacrococcygeal disorders, not elsewhere classified  Pain in left ankle and joints of left foot  Pain in right ankle and joints of right foot     Problem List Patient Active Problem List   Diagnosis Date Noted  . History of abnormal cervical Pap smear 04/01/2014  . Tobacco use disorder 01/25/2014  . Herpes labialis 08/04/2012  . Mood disorder ([place]) 08/04/2012  . Hyperlipidemia with target low density lipoprotein (LDL) cholesterol less than 100 mg/dL 10/07/2011  . Family history of heart disease in female family member before age 57 10/07/2011     Jerl Mina ,PT, DPT, E-RYT  10/10/2017, 9:20 AM  [place] [address] [place], [place], [address] Phone: [phone]   Fax:  [phone]  Name: Stacy Mckinney MRN: [id number] Date of Birth: 1971-10-18

## 2017-10-10 NOTE — Patient Instructions (Signed)
Pyramid massage over low ab (gentle) 3 strokes x 3 x 3 sets   Releasing pelvic floor after increased activity:          "V" heel slides, knee out   Upgrade to green band for door pulling exercises   Modify yard work activity to minimize repeated lifting( rake small branches to tarp into a pile then dump)  Carry a heavier branch with another person

## 2017-10-13 ENCOUNTER — Ambulatory Visit
Admission: RE | Admit: 2017-10-13 | Discharge: 2017-10-13 | Disposition: A | Discharge status: Home / Self Care | Payer: 59 | Source: Ambulatory Visit | Attending: Obstetrics & Gynecology | Admitting: Obstetrics & Gynecology

## 2017-10-13 ENCOUNTER — Encounter: Payer: Self-pay | Admitting: Radiology

## 2017-10-13 DIAGNOSIS — Z1239 Encounter for other screening for malignant neoplasm of breast: Secondary | ICD-10-CM

## 2017-10-13 DIAGNOSIS — Z1231 Encounter for screening mammogram for malignant neoplasm of breast: Secondary | ICD-10-CM | POA: Insufficient documentation

## 2017-10-25 ENCOUNTER — Ambulatory Visit: Payer: 59 | Admitting: Physical Therapy

## 2017-10-25 DIAGNOSIS — M6281 Muscle weakness (generalized): Secondary | ICD-10-CM

## 2017-10-25 DIAGNOSIS — M25571 Pain in right ankle and joints of right foot: Secondary | ICD-10-CM

## 2017-10-25 DIAGNOSIS — M25572 Pain in left ankle and joints of left foot: Secondary | ICD-10-CM

## 2017-10-25 DIAGNOSIS — M533 Sacrococcygeal disorders, not elsewhere classified: Secondary | ICD-10-CM

## 2017-10-25 NOTE — Patient Instructions (Addendum)
Loosening sacroiliac joint when it feels stuck   Pelvic tilts:  Mini squat position   Feet shoulder width apart, a few inches in front of wall, lean against wall by squeezing shoulder blades together and back of head pressed against the wall. Inhale tilt pelvis forward, exhale, pressinto feet and rock pelvis back towards wall   10reps   Copyright  VHI. All rights reserved.    Clam shells   Knee out while laying on L side 10 reps    Drawing thigh up and back "bicylce" 5 reps     Side of hip stretch:  Reclined twist for hips and side of the hips/ legs  Lay on your back, knees bend Scoot hips to the R , leave shoulders in place Strap on ballmounds of R foot, hold strap in L hand Straighten L leg and drop R leg over to the L, resting onto pillows to keep leg at the same width of hips 5 breaths       __________   Strengthening:   use mini squat position 10 reps inhhale, lower down, exhale rise up,

## 2017-10-25 NOTE — Therapy (Signed)
[place] MAIN [facility] SERVICES [address] [place], [place], [address] Phone: [phone]   Fax:  [phone]  Physical Therapy Treatment  Patient Details  Name: Stacy Mckinney MRN: [id number] Date of Birth: Jan 03, 1971 Referring Provider: Dr. Vikki Ports Ward    Encounter Date: 10/25/2017  PT End of Session - 10/25/17 1504    Visit Number  6    Number of Visits  12    Date for PT Re-Evaluation  12/14/17    PT Start Time  7116    PT Stop Time  1504    PT Time Calculation (min)  61 min    Activity Tolerance  Patient tolerated treatment well;No increased pain    Behavior During Therapy  WFL for tasks assessed/performed       Past Medical History:  Diagnosis Date  . Abdominal pain   . Anxiety   . Bipolar disorder ([place])   . Bladder disorder   . FHx: cardiovascular disease   . H/O ovarian cystectomy   . Heart murmur   . Hyperlipidemia   . Nausea   . Smoker   . Tobacco use disorder     Past Surgical History:  Procedure Laterality Date  . ABDOMINAL HYSTERECTOMY  05/23/2016  . CYSTOSCOPY  05/23/2017   Procedure: CYSTOSCOPY;  Surgeon: Ward, Honor Loh, MD;  Location: [facility];  Service: Gynecology;;  . ELBOW SURGERY     left elbow  . LAPAROSCOPIC APPENDECTOMY  10/23/2012   Procedure: APPENDECTOMY LAPAROSCOPIC;  Surgeon: Earnstine Regal, MD;  Location: [facility];  Service: General;  Laterality: N/A;  . LAPAROSCOPIC HYSTERECTOMY N/A 05/23/2017   Procedure: HYSTERECTOMY TOTAL LAPAROSCOPIC;  Surgeon: Ward, Honor Loh, MD;  Location: [facility];  Service: Gynecology;  Laterality: N/A;  . LAPAROSCOPIC OVARIAN CYSTECTOMY    . LAPAROSCOPIC UNILATERAL SALPINGECTOMY Left 05/23/2017   Procedure: LAPAROSCOPIC UNILATERAL SALPINGECTOMY;  Surgeon: Ward, Honor Loh, MD;  Location: [facility];  Service: Gynecology;  Laterality: Left;  . LAPAROSCOPIC UNILATERAL SALPINGO OOPHERECTOMY Right 05/23/2017   Procedure: LAPAROSCOPIC UNILATERAL SALPINGO OOPHORECTOMY;  Surgeon:  Ward, Honor Loh, MD;  Location: [facility];  Service: Gynecology;  Laterality: Right;  . LEEP    . RHINOPLASTY  age 46   due to broken nose  . TONSILLECTOMY     age 46    There were no vitals filed for this visit.  Subjective Assessment - 10/25/17 1406    Subjective  Pt had a fall 9 days ago when she stepped backwards over a stump and landed on her R side. Pt feels her R sided pain is much worse than prior to surgery.  The R gluts felt bruised but she had no visible bruise.   The pain does not radiate.   Pt tried salt baths, gabapentin, heat, massage chair which provided temporary relief. Her urinary urge has also returned     Pertinent History  Pt has a fall onto buttocks 1 weeks ago with increased pain that resolved with Gabapentin and rest. She had missed a step.  GYN HX: no pregnancies. Denied digestives issues. No FHx of CA. Hx of sit ups and crunches.     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          [facility] PT Assessment - 10/25/17 1416      Single Leg Stance   Comments  lower L leg triggers R abdominal pain, hip ext on R causes R  LBP       Palpation   Palpation comment  tenderness/ tensions along sacrotuberous ligament R ( decreased post Tx) limited in hip ext and flexion                    [facility] Adult PT Treatment/Exercise - 10/25/17 1458      Neuro Re-ed    Neuro Re-ed Details   see pt instructions       Moist Heat Therapy   Number Minutes Moist Heat  5 Minutes    Moist Heat Location  Other (comment) R glut      Manual Therapy   Manual therapy comments  STM/ MWM along R SIJ area              PT Education - 10/25/17 1502    Education provided  Yes    Education Details  HEP    Person(s) Educated  Patient    Methods  Explanation;Demonstration;Tactile cues;Verbal cues;Handout    Comprehension  Returned demonstration;Verbalized understanding          PT Long Term  Goals - 10/25/17 1507      PT LONG TERM GOAL #1   Title  Pt will report no pain with driving across 20 min in order to commute     Time  12    Period  Weeks    Status  Achieved      PT LONG TERM GOAL #2   Title  Pt have decreased scar restrictions and be able to achieve increased sidebend L from 51.cm digit III to floor to 50 cm and rotation from 25% to > 45%  in order to stoop for training dogs     Time  12    Period  Weeks    Status  Achieved      PT LONG TERM GOAL #3   Title  Pt will be able to walk 2 hours, stoop with proper body mechanics, and twist body with co-activation of deep core mm in order to train dogs     Time  12    Period  Weeks    Status  Achieved      PT LONG TERM GOAL #4   Title  Pt will demo no lumbopelvic perturbations with deep core 2-3 for 5 reps in order to progress to upright deep core strengthening to resist the pull of his 70 lbs      Time  12    Period  Weeks    Status  Partially Met      PT LONG TERM GOAL #5   Title  Pt will be able to demo pulling 70 lbs with the cable column in order to minimize risk for injury when walking her dog     Time  12    Period  Weeks    Status  On-going      PT LONG TERM GOAL #6   Title  Pt will decrease her [place] score from 61% to < 50% in order to return to ADLS and improve QOL ( 10:23:  30%)     Time  12    Period  Weeks    Status  Achieved      PT LONG TERM GOAL #7   Title  Pt will demo increased L hip abduction 2/5 to 4-/5 and plantar flexion from 2 reps Grade 3/5 to 10 reps to transverse through uneven terrain with dogs      Time  12  Period  Weeks    Status  New      PT LONG TERM GOAL #8   Title  Pt will demo no tightness at plantar fascia B and more weightbearing in ballmound and heels in bilateral stance in order to minimize feet pain and to have endurance with travels     Time  12    Period  Weeks    Status  On-going            Plan - 10/25/17 1504    Clinical Impression Statement  Pt has a  relapse with a fall backwards onto a tree stump and she has not recovered across the past 9 days. Pt showed increased hyppmobility in R SIJ and pain with R hip ext in standing and lowered L leg down from hip flex/ abduction, ER.  Following Tx, pt showed less tensions/ tenderness along sacrotuberous ligament with increased SIJ mobility and no pain with hip movements. Pt reported tenderness in the R glut but the R abdominal area still made no change.  Pt tolerated today's Tx without complaints. Pt continues to benefits from skilled PT.   Rehab Potential  Good    PT Frequency  1x / week    PT Duration  12 weeks    PT Treatment/Interventions  Neuromuscular re-education;Cognitive remediation;Functional mobility training;Stair training;Gait training;Moist Heat;Aquatic Therapy;Therapeutic activities;Patient/family education;Therapeutic exercise;Manual techniques;Scar mobilization;Taping    Consulted and Agree with Plan of Care  Patient       Patient will benefit from skilled therapeutic intervention in order to improve the following deficits and impairments:  Increased fascial restricitons, Postural dysfunction, Pain, Improper body mechanics, Decreased coordination, Decreased activity tolerance, Decreased safety awareness, Decreased strength, Decreased balance, Decreased endurance, Increased muscle spasms, Difficulty walking, Impaired sensation, Decreased scar mobility, Decreased range of motion, Decreased mobility  Visit Diagnosis: Muscle weakness (generalized)  Sacrococcygeal disorders, not elsewhere classified  Pain in left ankle and joints of left foot  Pain in right ankle and joints of right foot     Problem List Patient Active Problem List   Diagnosis Date Noted  . History of abnormal cervical Pap smear 04/01/2014  . Tobacco use disorder 01/25/2014  . Herpes labialis 08/04/2012  . Mood disorder ([place]) 08/04/2012  . Hyperlipidemia with target low density lipoprotein (LDL) cholesterol less  than 100 mg/dL 10/07/2011  . Family history of heart disease in female family member before age 39 10/07/2011    Jerl Mina ,PT, DPT, E-RYT  10/25/2017, 3:08 PM  [place] MAIN [facility] SERVICES [address] [place], [place], [address] Phone: [phone]   Fax:  [phone]  Name: Stacy Mckinney MRN: [id number] Date of Birth: 03-10-1971

## 2017-11-03 ENCOUNTER — Ambulatory Visit: Payer: 59 | Attending: Obstetrics & Gynecology | Admitting: Physical Therapy

## 2017-11-03 DIAGNOSIS — M6281 Muscle weakness (generalized): Secondary | ICD-10-CM

## 2017-11-03 DIAGNOSIS — M533 Sacrococcygeal disorders, not elsewhere classified: Secondary | ICD-10-CM | POA: Diagnosis present

## 2017-11-03 DIAGNOSIS — M25571 Pain in right ankle and joints of right foot: Secondary | ICD-10-CM | POA: Insufficient documentation

## 2017-11-03 DIAGNOSIS — M25572 Pain in left ankle and joints of left foot: Secondary | ICD-10-CM | POA: Diagnosis present

## 2017-11-04 NOTE — Patient Instructions (Addendum)
Single heel raises    Forward lunge, back heel down, slight knee bend

## 2017-11-04 NOTE — Therapy (Signed)
[place] MAIN [facility] SERVICES [address] [place], [place], [address] Phone: [phone]   Fax:  [phone]  Physical Therapy Treatment  Patient Details  Name: Stacy Mckinney MRN: [id number] Date of Birth: July 30, 1971 Referring Provider: Dr. Vikki Ports Ward    Encounter Date: 11/03/2017  PT End of Session - 11/04/17 1433    Visit Number  7    Number of Visits  12    Date for PT Re-Evaluation  12/14/17    PT Start Time  0354    PT Stop Time  6568    PT Time Calculation (min)  59 min    Activity Tolerance  Patient tolerated treatment well;No increased pain    Behavior During Therapy  WFL for tasks assessed/performed       Past Medical History:  Diagnosis Date  . Abdominal pain   . Anxiety   . Bipolar disorder ([place])   . Bladder disorder   . FHx: cardiovascular disease   . H/O ovarian cystectomy   . Heart murmur   . Hyperlipidemia   . Nausea   . Smoker   . Tobacco use disorder     Past Surgical History:  Procedure Laterality Date  . ABDOMINAL HYSTERECTOMY  05/23/2016  . CYSTOSCOPY  05/23/2017   Procedure: CYSTOSCOPY;  Surgeon: Ward, Honor Loh, MD;  Location: [facility];  Service: Gynecology;;  . ELBOW SURGERY     left elbow  . LAPAROSCOPIC APPENDECTOMY  10/23/2012   Procedure: APPENDECTOMY LAPAROSCOPIC;  Surgeon: Earnstine Regal, MD;  Location: [facility];  Service: General;  Laterality: N/A;  . LAPAROSCOPIC HYSTERECTOMY N/A 05/23/2017   Procedure: HYSTERECTOMY TOTAL LAPAROSCOPIC;  Surgeon: Ward, Honor Loh, MD;  Location: [facility];  Service: Gynecology;  Laterality: N/A;  . LAPAROSCOPIC OVARIAN CYSTECTOMY    . LAPAROSCOPIC UNILATERAL SALPINGECTOMY Left 05/23/2017   Procedure: LAPAROSCOPIC UNILATERAL SALPINGECTOMY;  Surgeon: Ward, Honor Loh, MD;  Location: [facility];  Service: Gynecology;  Laterality: Left;  . LAPAROSCOPIC UNILATERAL SALPINGO OOPHERECTOMY Right 05/23/2017   Procedure: LAPAROSCOPIC UNILATERAL SALPINGO OOPHORECTOMY;  Surgeon: Ward,  Honor Loh, MD;  Location: [facility];  Service: Gynecology;  Laterality: Right;  . LEEP    . RHINOPLASTY  age 56   due to broken nose  . TONSILLECTOMY     age 30    There were no vitals filed for this visit.  Subjective Assessment - 11/03/17 1419    Subjective  Pt reports she felt good after last session. Pt did 2600 steps and more after going to her dog's tracking class and had no back nor abdominal pulling pain.  Pt had to pull on her dog's leash alot    Pertinent History  Pt has a fall onto buttocks 1 weeks ago with increased pain that resolved with Gabapentin and rest. She had missed a step.  GYN HX: no pregnancies. Denied digestives issues. No FHx of CA. Hx of sit ups and crunches.     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          [facility] PT Assessment - 11/04/17 0908      PROM   Overall PROM Comments  excessive B ankle supination ( Hx of ankle sprains)       Strength   Overall Strength Comments  8 Reps On R, 4 rep son L PF Grade 4/5       Palpation   Palpation comment  tensions at  extensor retinaculum and adductor hallucis ( both heads) and dorsal interoseus B                   [facility] Adult PT Treatment/Exercise - 11/04/17 1325      Manual Therapy   Manual therapy comments  AP/PA mobs at mid foot, STM to faciliate abduction    Kinesiotex  Create Space fan strips  B dorsum of feet , across metatarsal for supinat             PT Education - 11/04/17 1433    Education provided  Yes    Education Details  HEP    Person(s) Educated  Patient    Methods  Explanation;Demonstration;Tactile cues;Verbal cues;Handout    Comprehension  Returned demonstration;Verbalized understanding          PT Long Term Goals - 10/25/17 1507      PT LONG TERM GOAL #1   Title  Pt will report no pain with driving across 20 min in order to commute     Time  12    Period  Weeks    Status  Achieved       PT LONG TERM GOAL #2   Title  Pt have decreased scar restrictions and be able to achieve increased sidebend L from 51.cm digit III to floor to 50 cm and rotation from 25% to > 45%  in order to stoop for training dogs     Time  12    Period  Weeks    Status  Achieved      PT LONG TERM GOAL #3   Title  Pt will be able to walk 2 hours, stoop with proper body mechanics, and twist body with co-activation of deep core mm in order to train dogs     Time  12    Period  Weeks    Status  Achieved      PT LONG TERM GOAL #4   Title  Pt will demo no lumbopelvic perturbations with deep core 2-3 for 5 reps in order to progress to upright deep core strengthening to resist the pull of his 70 lbs      Time  12    Period  Weeks    Status  Partially Met      PT LONG TERM GOAL #5   Title  Pt will be able to demo pulling 70 lbs with the cable column in order to minimize risk for injury when walking her dog     Time  12    Period  Weeks    Status  On-going      PT LONG TERM GOAL #6   Title  Pt will decrease her [place] score from 61% to < 50% in order to return to ADLS and improve QOL ( 10:23:  30%)     Time  12    Period  Weeks    Status  Achieved      PT LONG TERM GOAL #7   Title  Pt will demo increased L hip abduction 2/5 to 4-/5 and plantar flexion from 2 reps Grade 3/5 to 10 reps to transverse through uneven terrain with dogs      Time  12    Period  Weeks    Status  New      PT LONG TERM GOAL #8   Title  Pt will demo no tightness at plantar fascia B and more weightbearing in ballmound and heels in bilateral stance  in order to minimize feet pain and to have endurance with travels     Time  12    Period  Weeks    Status  On-going            Plan - 11/04/17 1433    Clinical Impression Statement  Pt is progress well with report of being able to complete the dog tracking course with her dog without pain. Addressed lower kinetic chain today in order to help pt improve feet co-activation,  mobility, and propioception to minimize falls and tranversing over uneven terrain with her dog. Pt demo'd increased toe extensions and abduction,  decreased restrictions over extension retinaculum, and hypomobility at midfoot joints  following manual Tx today. Plan to address her genu valgus at next session.  Pt continues to benefit from skilled PT.      Rehab Potential  Good    PT Frequency  1x / week    PT Duration  12 weeks    PT Treatment/Interventions  Neuromuscular re-education;Cognitive remediation;Functional mobility training;Stair training;Gait training;Moist Heat;Aquatic Therapy;Therapeutic activities;Patient/family education;Therapeutic exercise;Manual techniques;Scar mobilization;Taping    Consulted and Agree with Plan of Care  Patient       Patient will benefit from skilled therapeutic intervention in order to improve the following deficits and impairments:  Increased fascial restricitons, Postural dysfunction, Pain, Improper body mechanics, Decreased coordination, Decreased activity tolerance, Decreased safety awareness, Decreased strength, Decreased balance, Decreased endurance, Increased muscle spasms, Difficulty walking, Impaired sensation, Decreased scar mobility, Decreased range of motion, Decreased mobility  Visit Diagnosis: Muscle weakness (generalized)  Sacrococcygeal disorders, not elsewhere classified  Pain in left ankle and joints of left foot  Pain in right ankle and joints of right foot     Problem List Patient Active Problem List   Diagnosis Date Noted  . History of abnormal cervical Pap smear 04/01/2014  . Tobacco use disorder 01/25/2014  . Herpes labialis 08/04/2012  . Mood disorder ([place]) 08/04/2012  . Hyperlipidemia with target low density lipoprotein (LDL) cholesterol less than 100 mg/dL 10/07/2011  . Family history of heart disease in female family member before age 85 10/07/2011    Jerl Mina ,PT, DPT, E-RYT  11/04/2017, 2:39 PM  [place] MAIN [facility] SERVICES [place], [place], [address] Phone: [phone]   Fax:  [phone]  Name: Stacy Mckinney MRN: [id number] Date of Birth: [birth year]

## 2017-11-10 ENCOUNTER — Ambulatory Visit: Payer: 59 | Admitting: Physical Therapy

## 2017-11-10 DIAGNOSIS — M6281 Muscle weakness (generalized): Secondary | ICD-10-CM | POA: Diagnosis not present

## 2017-11-10 DIAGNOSIS — M533 Sacrococcygeal disorders, not elsewhere classified: Secondary | ICD-10-CM

## 2017-11-10 DIAGNOSIS — M25572 Pain in left ankle and joints of left foot: Secondary | ICD-10-CM

## 2017-11-10 DIAGNOSIS — M25571 Pain in right ankle and joints of right foot: Secondary | ICD-10-CM

## 2017-11-10 NOTE — Patient Instructions (Signed)
  ______    Alternate every 5 reps   ( 5 sets of frog for 5, prone heel press for 5)   Frog stretch: laying on belly with pillow under hips, knees bent, inhale do nothing, exhale let ankles fall apart     Prone Heel Press for strengthening sacro-iliac joints  1. Lie on your belly. If you have an arch in your low back or it feels umcomfortable, place a pillow under your low belly/hips to make sure your low back feel comfortable.   2. Place our forehead on top of your palms.      Widen your knees apart for starting position.   3. Inhale, feel belly and low back expand  4. Exhale, feel belly hug in, press heel together and count aloud for 5 sec. Then relax the heel squeezing.  Perform 10 reps of 5 sec holds. 2 sets/ day.    If you feel entire buttock tighten too much or feel low back pain, apply 50% less effort. As you press your heel together, you will feel as if your pubic bone (front of your pelvis) and sacrum (back of your pelvis) gentle move towards each other or your low abdominal muscles hug in more.        ________  Stable  Table top = runners strengthening   hands and knees shoulder/ hip width apart . Claw hands ( ballmounds down) and toes tucked, chin neutral  L  leg straight with toes tucked under on the ground,  R arm up "half V" position shoulder squeeze in and down on back   3 reps each side   Child pose , repeat for 3 sets    _________   Ankle up and out with red band 26 rep on R, 26 on L

## 2017-11-10 NOTE — Therapy (Signed)
[place] MAIN [facility] SERVICES [address] [place], [place], [address] Phone: [phone]   Fax:  [phone]  Physical Therapy Treatment  Patient Details  Name: Stacy Mckinney MRN: [id number] Date of Birth: 10/09/1971 Referring Provider: Dr. Vikki Ports Ward    Encounter Date: 11/10/2017  PT End of Session - 11/10/17 1336    Visit Number  8    Number of Visits  12    Date for PT Re-Evaluation  12/14/17    PT Start Time  0915    PT Stop Time  1011    PT Time Calculation (min)  56 min    Activity Tolerance  Patient tolerated treatment well;No increased pain    Behavior During Therapy  WFL for tasks assessed/performed       Past Medical History:  Diagnosis Date  . Abdominal pain   . Anxiety   . Bipolar disorder ([place])   . Bladder disorder   . FHx: cardiovascular disease   . H/O ovarian cystectomy   . Heart murmur   . Hyperlipidemia   . Nausea   . Smoker   . Tobacco use disorder     Past Surgical History:  Procedure Laterality Date  . ABDOMINAL HYSTERECTOMY  05/23/2016  . CYSTOSCOPY  05/23/2017   Procedure: CYSTOSCOPY;  Surgeon: Ward, Honor Loh, MD;  Location: [facility];  Service: Gynecology;;  . ELBOW SURGERY     left elbow  . LAPAROSCOPIC APPENDECTOMY  10/23/2012   Procedure: APPENDECTOMY LAPAROSCOPIC;  Surgeon: Earnstine Regal, MD;  Location: [facility];  Service: General;  Laterality: N/A;  . LAPAROSCOPIC HYSTERECTOMY N/A 05/23/2017   Procedure: HYSTERECTOMY TOTAL LAPAROSCOPIC;  Surgeon: Ward, Honor Loh, MD;  Location: [facility];  Service: Gynecology;  Laterality: N/A;  . LAPAROSCOPIC OVARIAN CYSTECTOMY    . LAPAROSCOPIC UNILATERAL SALPINGECTOMY Left 05/23/2017   Procedure: LAPAROSCOPIC UNILATERAL SALPINGECTOMY;  Surgeon: Ward, Honor Loh, MD;  Location: [facility];  Service: Gynecology;  Laterality: Left;  . LAPAROSCOPIC UNILATERAL SALPINGO OOPHERECTOMY Right 05/23/2017   Procedure: LAPAROSCOPIC UNILATERAL SALPINGO OOPHORECTOMY;  Surgeon:  Ward, Honor Loh, MD;  Location: [facility];  Service: Gynecology;  Laterality: Right;  . LEEP    . RHINOPLASTY  age 46   due to broken nose  . TONSILLECTOMY     age 46    There were no vitals filed for this visit.  Subjective Assessment - 11/10/17 0918    Subjective  Pt has a close fall at the bottom of her stairs but she was able to catch herself from falling.The motion of her catch, pulled something in her R hip and gluts and twisted her R ankle with sole inward.  Pt had a massage, castor oil, and epsom salt bath which helped eased the pulling sensations. Following last session, her feet felt a little inflamed but it felt okay.     Pertinent History  Pt has a fall onto buttocks 1 weeks ago with increased pain that resolved with Gabapentin and rest. She had missed a step.  GYN HX: no pregnancies. Denied digestives issues. No FHx of CA. Hx of sit ups and crunches.     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          [facility] PT Assessment - 11/10/17 0926      Observation/Other Assessments   Observations  L glut cramping with prone heel press ( modified exercise with posterior glut stretches)  Strength   Overall Strength Comments  posterior sling ( scaption LUE, R glut 4-/5 on gluts,  RUE/ L glut 5/5). hip ext on prone L 3+/5 , R 4/5        Palpation   SI assessment   R ASIS more anterior ( more equal post Tx). L SIJ hypomobile, limited nutation ( improved post Tx)                    [facility] Adult PT Treatment/Exercise - 11/10/17 1003      Neuro Re-ed    Neuro Re-ed Details   see pt instructions       Manual Therapy   Manual therapy comments  inferior mob at SIJ on L, STM/ sustained pressure along hip external rotators/ abductors L              PT Education - 11/10/17 1335    Education provided  Yes    Education Details  HEP    Person(s) Educated  Patient    Methods   Explanation;Demonstration;Tactile cues;Verbal cues;Handout    Comprehension  Verbalized understanding;Returned demonstration          PT Long Term Goals - 10/25/17 1507      PT LONG TERM GOAL #1   Title  Pt will report no pain with driving across 20 min in order to commute     Time  12    Period  Weeks    Status  Achieved      PT LONG TERM GOAL #2   Title  Pt have decreased scar restrictions and be able to achieve increased sidebend L from 51.cm digit III to floor to 50 cm and rotation from 25% to > 45%  in order to stoop for training dogs     Time  12    Period  Weeks    Status  Achieved      PT LONG TERM GOAL #3   Title  Pt will be able to walk 2 hours, stoop with proper body mechanics, and twist body with co-activation of deep core mm in order to train dogs     Time  12    Period  Weeks    Status  Achieved      PT LONG TERM GOAL #4   Title  Pt will demo no lumbopelvic perturbations with deep core 2-3 for 5 reps in order to progress to upright deep core strengthening to resist the pull of his 70 lbs      Time  12    Period  Weeks    Status  Partially Met      PT LONG TERM GOAL #5   Title  Pt will be able to demo pulling 70 lbs with the cable column in order to minimize risk for injury when walking her dog     Time  12    Period  Weeks    Status  On-going      PT LONG TERM GOAL #6   Title  Pt will decrease her [place] score from 61% to < 50% in order to return to ADLS and improve QOL ( 10:23:  30%)     Time  12    Period  Weeks    Status  Achieved      PT LONG TERM GOAL #7   Title  Pt will demo increased L hip abduction 2/5 to 4-/5 and plantar flexion from 2 reps Grade 3/5 to 10 reps to  transverse through uneven terrain with dogs      Time  12    Period  Weeks    Status  New      PT LONG TERM GOAL #8   Title  Pt will demo no tightness at plantar fascia B and more weightbearing in [place] and heels in bilateral stance in order to minimize feet pain and to have  endurance with travels     Time  12    Period  Weeks    Status  On-going            Plan - 11/10/17 1336    Clinical Impression Statement  Pt is progressing well towards her goals. Pt showed good carry over from last session which addressed increasing mobility in her feet. Pt was advanced to ankle eversion strengthening today. Pt also required manual Tx to decrease tightness in L glut. Also added glut and posterior thoracolumbar strengthening in quadriped position today to improve glut strength.  Pt is demonstrating improved balance as she reported she was able to catch herself from a fall last week. However, pt will benefit from further strengthening and dynamic balance training at next session.Pt continues to benefit from skilled PT.     Rehab Potential  Good    PT Frequency  1x / week    PT Duration  12 weeks    PT Treatment/Interventions  Neuromuscular re-education;Cognitive remediation;Functional mobility training;Stair training;Gait training;Moist Heat;Aquatic Therapy;Therapeutic activities;Patient/family education;Therapeutic exercise;Manual techniques;Scar mobilization;Taping    Consulted and Agree with Plan of Care  Patient       Patient will benefit from skilled therapeutic intervention in order to improve the following deficits and impairments:  Increased fascial restricitons, Postural dysfunction, Pain, Improper body mechanics, Decreased coordination, Decreased activity tolerance, Decreased safety awareness, Decreased strength, Decreased balance, Decreased endurance, Increased muscle spasms, Difficulty walking, Impaired sensation, Decreased scar mobility, Decreased range of motion, Decreased mobility  Visit Diagnosis: Muscle weakness (generalized)  Sacrococcygeal disorders, not elsewhere classified  Pain in left ankle and joints of left foot  Pain in right ankle and joints of right foot     Problem List Patient Active Problem List   Diagnosis Date Noted  . History  of abnormal cervical Pap smear 04/01/2014  . Tobacco use disorder 01/25/2014  . Herpes labialis 08/04/2012  . Mood disorder ([place]) 08/04/2012  . Hyperlipidemia with target low density lipoprotein (LDL) cholesterol less than 100 mg/dL 10/07/2011  . Family history of heart disease in female family member before age 30 10/07/2011    Jerl Mina  ,PT, DPT, E-RYT  11/10/2017, 1:40 PM  [place] MAIN [facility] SERVICES [place], [place], [address] Phone: [phone]   Fax:  [phone]  Name: Ahnya Akre MRN: [id number] Date of Birth: 1971-02-18

## 2017-11-24 ENCOUNTER — Ambulatory Visit: Payer: 59 | Admitting: Physical Therapy

## 2017-11-24 DIAGNOSIS — M25572 Pain in left ankle and joints of left foot: Secondary | ICD-10-CM

## 2017-11-24 DIAGNOSIS — M533 Sacrococcygeal disorders, not elsewhere classified: Secondary | ICD-10-CM

## 2017-11-24 DIAGNOSIS — M6281 Muscle weakness (generalized): Secondary | ICD-10-CM

## 2017-11-24 DIAGNOSIS — M25571 Pain in right ankle and joints of right foot: Secondary | ICD-10-CM

## 2017-11-24 NOTE — Therapy (Signed)
[place] MAIN [facility] SERVICES [address] [place], [place], [address] Phone: [phone]   Fax:  [phone]  Physical Therapy Treatment  Patient Details  Name: Stacy Mckinney MRN: [id number] Date of Birth: 01-04-1971 Referring Provider: Dr. Vikki Ports Ward    Encounter Date: 11/24/2017  PT End of Session - 11/24/17 1019    Visit Number  9    Number of Visits  12    Date for PT Re-Evaluation  12/14/17    PT Start Time  5993    PT Stop Time  1105    PT Time Calculation (min)  50 min    Activity Tolerance  Patient tolerated treatment well;No increased pain    Behavior During Therapy  WFL for tasks assessed/performed       Past Medical History:  Diagnosis Date  . Abdominal pain   . Anxiety   . Bipolar disorder ([place])   . Bladder disorder   . FHx: cardiovascular disease   . H/O ovarian cystectomy   . Heart murmur   . Hyperlipidemia   . Nausea   . Smoker   . Tobacco use disorder     Past Surgical History:  Procedure Laterality Date  . ABDOMINAL HYSTERECTOMY  05/23/2016  . CYSTOSCOPY  05/23/2017   Procedure: CYSTOSCOPY;  Surgeon: Ward, Honor Loh, MD;  Location: [facility];  Service: Gynecology;;  . ELBOW SURGERY     left elbow  . LAPAROSCOPIC APPENDECTOMY  10/23/2012   Procedure: APPENDECTOMY LAPAROSCOPIC;  Surgeon: Earnstine Regal, MD;  Location: [facility];  Service: General;  Laterality: N/A;  . LAPAROSCOPIC HYSTERECTOMY N/A 05/23/2017   Procedure: HYSTERECTOMY TOTAL LAPAROSCOPIC;  Surgeon: Ward, Honor Loh, MD;  Location: [facility];  Service: Gynecology;  Laterality: N/A;  . LAPAROSCOPIC OVARIAN CYSTECTOMY    . LAPAROSCOPIC UNILATERAL SALPINGECTOMY Left 05/23/2017   Procedure: LAPAROSCOPIC UNILATERAL SALPINGECTOMY;  Surgeon: Ward, Honor Loh, MD;  Location: [facility];  Service: Gynecology;  Laterality: Left;  . LAPAROSCOPIC UNILATERAL SALPINGO OOPHERECTOMY Right 05/23/2017   Procedure: LAPAROSCOPIC UNILATERAL SALPINGO OOPHORECTOMY;  Surgeon:  Ward, Honor Loh, MD;  Location: [facility];  Service: Gynecology;  Laterality: Right;  . LEEP    . RHINOPLASTY  age 21   due to broken nose  . TONSILLECTOMY     age 89    There were no vitals filed for this visit.  Subjective Assessment - 11/24/17 1019    Subjective  Pt was able to enjoy basketball game climbing bleachers and walking in a city without pain, only discomfort. Pt was able to pull back a 60-75 lb hunting dog on a leash without pain afterwards. Pt only fely fatigued afterwards.      Pertinent History  Pt has a fall onto buttocks 1 weeks ago with increased pain that resolved with Gabapentin and rest. She had missed a step.  GYN HX: no pregnancies. Denied digestives issues. No FHx of CA. Hx of sit ups and crunches.     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          [facility] PT Assessment - 11/24/17 1203      Strength   Overall Strength Comments  hip strength abd 4/5 B       Palpation   Palpation comment  decreased L ankle eversion ROM, increased tensions at R adductor hallucis / dorsal interroseus R (decreased postTx)  Pelvic Floor Special Questions - 11/24/17 1202    Pelvic Floor Internal Exam  pt was provided details of the exam, pt consented verbally without contraindication      Exam Type  Vaginal    Palpation    increased tightness/ tenderness at L ilio/ischiococcgeus with referred pain at R groin ( post Tx, decreased with no referred pain)         [facility] Adult PT Treatment/Exercise - 11/24/17 1207      Neuro Re-ed    Neuro Re-ed Details   pelvic floor coordination at anterior triangle, self foot massage over metatrsal area on R foot to increase toe  abduction       Manual Therapy   Manual therapy comments  AP/PA mob at L cuboid bone, STM at R dorsal interroeus/adductor hallucis     Internal Pelvic Floor  STM/thiele massage at problem areas noted in assessment               PT Education - 11/24/17 1208    Education provided  Yes    Education Details  HEP    Person(s) Educated  Patient    Methods  Explanation;Demonstration;Tactile cues;Verbal cues;Handout    Comprehension  Returned demonstration;Verbalized understanding          PT Long Term Goals - 11/24/17 1022      PT LONG TERM GOAL #1   Title  Pt will report no pain with driving across 20 min in order to commute     Time  12    Period  Weeks    Status  Achieved      PT LONG TERM GOAL #2   Title  Pt have decreased scar restrictions and be able to achieve increased sidebend L from 51.cm digit III to floor to 50 cm and rotation from 25% to > 45%  in order to stoop for training dogs     Time  12    Period  Weeks    Status  Achieved      PT LONG TERM GOAL #3   Title  Pt will be able to walk 2 hours, stoop with proper body mechanics, and twist body with co-activation of deep core mm in order to train dogs     Time  12    Period  Weeks    Status  Achieved      PT LONG TERM GOAL #4   Title  Pt will demo no lumbopelvic perturbations with deep core 2-3 for 5 reps in order to progress to upright deep core strengthening to resist the pull of his 70 lbs      Time  12    Period  Weeks    Status  Partially Met      PT LONG TERM GOAL #5   Title  Pt will be able to demo pulling 70 lbs with the cable column in order to minimize risk for injury when walking her dog     Time  12    Period  Weeks    Status  On-going      PT LONG TERM GOAL #6   Title  Pt will decrease her [place] score from 61% to < 50% in order to return to ADLS and improve QOL ( 10:23:  30%)     Time  12    Period  Weeks    Status  Achieved      PT LONG TERM GOAL #7   Title  Pt will demo  increased L hip abduction 2/5 to 4-/5 and plantar flexion from 2 reps Grade 3/5 to 10 reps to transverse through uneven terrain with dogs      Time  12    Period  Weeks    Status  New      PT LONG TERM GOAL #8   Title  Pt will demo  no tightness at plantar fascia B and more weightbearing in [place] and heels in bilateral stance in order to minimize feet pain and to have endurance with travels     Time  12    Period  Weeks    Status  Achieved            Plan - 11/24/17 1210    Clinical Impression Statement  Pt is progressing well with self-management of pain in more community activities and return to hobbies ( training her dog). Pt demo'd increased tensions of L pelvic floor mm with referred pulling pain at R groin. Following Tx, this referred pain decreased along with tensions and tenderness of the pelvic floor mm. Pt also showed improved feet mobility and required manual Tx in less areas of her feet today. Pt continues to benefit from skilled PT. Plan to incorporate weight training to progress towards remaining goals.    Rehab Potential  Good    PT Frequency  1x / week    PT Duration  12 weeks    PT Treatment/Interventions  Neuromuscular re-education;Cognitive remediation;Functional mobility training;Stair training;Gait training;Moist Heat;Aquatic Therapy;Therapeutic activities;Patient/family education;Therapeutic exercise;Manual techniques;Scar mobilization;Taping    Consulted and Agree with Plan of Care  Patient       Patient will benefit from skilled therapeutic intervention in order to improve the following deficits and impairments:  Increased fascial restricitons, Postural dysfunction, Pain, Improper body mechanics, Decreased coordination, Decreased activity tolerance, Decreased safety awareness, Decreased strength, Decreased balance, Decreased endurance, Increased muscle spasms, Difficulty walking, Impaired sensation, Decreased scar mobility, Decreased range of motion, Decreased mobility  Visit Diagnosis: Muscle weakness (generalized)  Sacrococcygeal disorders, not elsewhere classified  Pain in left ankle and joints of left foot  Pain in right ankle and joints of right foot     Problem List Patient  Active Problem List   Diagnosis Date Noted  . History of abnormal cervical Pap smear 04/01/2014  . Tobacco use disorder 01/25/2014  . Herpes labialis 08/04/2012  . Mood disorder ([place]) 08/04/2012  . Hyperlipidemia with target low density lipoprotein (LDL) cholesterol less than 100 mg/dL 10/07/2011  . Family history of heart disease in female family member before age 77 10/07/2011    Jerl Mina ,PT, DPT, E-RYT  11/24/2017, 12:14 PM  [place], [place], [address] Phone: [phone]   Fax:  [phone]  Name: Stacy Mckinney MRN: [id number] Date of Birth: [birth year]

## 2017-12-06 ENCOUNTER — Emergency Department
Admission: EM | Admit: 2017-12-06 | Discharge: 2017-12-06 | Disposition: A | Discharge status: Home / Self Care | Payer: 59 | Attending: Student in an Organized Health Care Education/Training Program | Admitting: Student in an Organized Health Care Education/Training Program

## 2017-12-06 ENCOUNTER — Other Ambulatory Visit: Payer: Self-pay

## 2017-12-06 ENCOUNTER — Emergency Department: Payer: 59

## 2017-12-06 ENCOUNTER — Encounter: Payer: Self-pay | Admitting: Emergency Medicine

## 2017-12-06 ENCOUNTER — Ambulatory Visit: Payer: 59 | Admitting: Physical Therapy

## 2017-12-06 DIAGNOSIS — Z79899 Other long term (current) drug therapy: Secondary | ICD-10-CM | POA: Diagnosis not present

## 2017-12-06 DIAGNOSIS — R0789 Other chest pain: Secondary | ICD-10-CM

## 2017-12-06 DIAGNOSIS — R079 Chest pain, unspecified: Secondary | ICD-10-CM | POA: Diagnosis present

## 2017-12-06 DIAGNOSIS — F17210 Nicotine dependence, cigarettes, uncomplicated: Secondary | ICD-10-CM | POA: Insufficient documentation

## 2017-12-06 LAB — BASIC METABOLIC PANEL
Anion gap: 8 (ref 5–15)
BUN: 14 mg/dL (ref 6–20)
CALCIUM: 9.6 mg/dL (ref 8.9–10.3)
CO2: 23 mmol/L (ref 22–32)
CREATININE: 0.87 mg/dL (ref 0.44–1.00)
Chloride: 104 mmol/L (ref 101–111)
GFR calc Af Amer: >60 mL/min (ref >60)
GFR calc non Af Amer: >60 mL/min (ref >60)
Glucose, Bld: 105 mg/dL — ABNORMAL HIGH (ref 65–99)
POTASSIUM: 4.1 mmol/L (ref 3.5–5.1)
SODIUM: 135 mmol/L (ref 135–145)

## 2017-12-06 LAB — CBC
HEMATOCRIT: 42.1 % (ref 35.0–47.0)
Hemoglobin: 14.2 g/dL (ref 12.0–16.0)
MCH: 29.2 pg (ref 26.0–34.0)
MCHC: 33.6 g/dL (ref 32.0–36.0)
MCV: 86.8 fL (ref 80.0–100.0)
Platelets: 219 10*3/uL (ref 150–440)
RBC: 4.85 MIL/uL (ref 3.80–5.20)
RDW: 13.3 % (ref 11.5–14.5)
WBC: 8.4 10*3/uL (ref 3.6–11.0)

## 2017-12-06 LAB — TROPONIN I

## 2017-12-06 MED ORDER — TRAMADOL HCL 50 MG PO TABS: 50.0000 mg | ORAL_TABLET | Freq: Four times a day (QID) | ORAL | 0 refills | Status: AC | PRN

## 2017-12-06 MED ORDER — VALACYCLOVIR HCL 500 MG PO TABS: 1000.0000 mg | ORAL_TABLET | Freq: Three times a day (TID) | ORAL | 0 refills | Status: AC

## 2017-12-06 MED ORDER — PREDNISONE 10 MG PO TABS: 10.0000 mg | ORAL_TABLET | Freq: Every day | ORAL | 0 refills | Status: DC

## 2017-12-06 NOTE — ED Triage Notes (Signed)
Pt to ed with c/o chest pain, left side under breast.  Pt states pain awoke her during the night.  Pt reports pain is worse with movement worse with cough and sneezing.

## 2017-12-06 NOTE — ED Provider Notes (Signed)
[facility] Emergency Department Provider Note    First MD Initiated Contact with Patient 12/06/17 1518     (approximate)  I have reviewed the triage vital signs and the nursing notes.   HISTORY  Chief Complaint Chest Pain    HPI Stacy Mckinney is a 47 y.o. female with the below listed medical history with no recent hospitalizations presents with left-sided chest pain that started last night at 4:00 and has been persistent.  States it is mild to moderate and worsened with motion and positioning.  Denies any shortness of breath.  No fevers.  No nausea or vomiting.  Normal bowel movements.  No dysuria or hematuria.  Has had similar episodes in the past related to shingles outbreaks in similar distribution.  Past Medical History:  Diagnosis Date  . Abdominal pain   . Anxiety   . Bipolar disorder ([place])   . Bladder disorder   . FHx: cardiovascular disease   . H/O ovarian cystectomy   . Heart murmur   . Hyperlipidemia   . Nausea   . Smoker   . Tobacco use disorder    Family History  Problem Relation Age of Onset  . Heart disease Father   . Hypertension Father   . Stroke Maternal Grandmother   . Cancer Maternal Grandmother        ovarian  . Stroke Paternal Grandmother   . Depression Paternal Grandmother   . Cancer Paternal Grandmother        breast, ovarian  . Breast cancer Paternal Grandmother   . Cancer Maternal Aunt        ovarian  . Stroke Maternal Grandfather   . Stroke Paternal Grandfather    Past Surgical History:  Procedure Laterality Date  . ABDOMINAL HYSTERECTOMY  05/23/2016  . CYSTOSCOPY  05/23/2017   Procedure: CYSTOSCOPY;  Surgeon: Ward, Honor Loh, MD;  Location: [facility] ORS;  Service: Gynecology;;  . ELBOW SURGERY     left elbow  . LAPAROSCOPIC APPENDECTOMY  10/23/2012   Procedure: APPENDECTOMY LAPAROSCOPIC;  Surgeon: Earnstine Regal, MD;  Location: [facility];  Service: General;  Laterality: N/A;  . LAPAROSCOPIC HYSTERECTOMY N/A 05/23/2017    Procedure: HYSTERECTOMY TOTAL LAPAROSCOPIC;  Surgeon: Ward, Honor Loh, MD;  Location: [facility] ORS;  Service: Gynecology;  Laterality: N/A;  . LAPAROSCOPIC OVARIAN CYSTECTOMY    . LAPAROSCOPIC UNILATERAL SALPINGECTOMY Left 05/23/2017   Procedure: LAPAROSCOPIC UNILATERAL SALPINGECTOMY;  Surgeon: Ward, Honor Loh, MD;  Location: [facility] ORS;  Service: Gynecology;  Laterality: Left;  . LAPAROSCOPIC UNILATERAL SALPINGO OOPHERECTOMY Right 05/23/2017   Procedure: LAPAROSCOPIC UNILATERAL SALPINGO OOPHORECTOMY;  Surgeon: Ward, Honor Loh, MD;  Location: [facility] ORS;  Service: Gynecology;  Laterality: Right;  . LEEP    . RHINOPLASTY  age 39   due to broken nose  . TONSILLECTOMY     age 32   Patient Active Problem List   Diagnosis Date Noted  . History of abnormal cervical Pap smear 04/01/2014  . Tobacco use disorder 01/25/2014  . Herpes labialis 08/04/2012  . Mood disorder ([place]) 08/04/2012  . Hyperlipidemia with target low density lipoprotein (LDL) cholesterol less than 100 mg/dL 10/07/2011  . Family history of heart disease in female family member before age 396 10/07/2011      Prior to Admission medications   Medication Sig Start Date End Date Taking? Authorizing Provider  Coenzyme Q10 (COQ-10) 400 MG CAPS Take 400 mg by mouth every 7 (seven) days.     [provider]  diazepam (  VALIUM) 2 MG tablet TAKE 1 TABLET BY MOUTH EVERY 6 HOURS AS NEEDED FOR ANXIETY 05/19/17   Denita Lung, MD  fluticasone [facility]) 50 MCG/ACT nasal spray Place 1 spray into both nostrils daily as needed for allergies or rhinitis.    [provider]  gabapentin (NEURONTIN) 300 MG capsule Take 600 mg by mouth at bedtime.    [provider]  LAMICTAL 200 MG tablet TAKE 1 TABLET BY MOUTH  DAILY 10/20/16   Denita Lung, MD  meloxicam (MOBIC) 15 MG tablet Take 1 tablet (15 mg total) by mouth daily. Patient not taking: Reported on 07/14/2017 03/18/17   Hyatt, Max T, DPM  naproxen sodium (ANAPROX) 220 MG tablet  Take 220 mg by mouth 2 (two) times daily as needed.    [provider]  NEOMYCIN-POLYMYXIN-HYDROCORTISONE (CORTISPORIN) 1 % SOLN otic solution Apply 1-2 drops to toe BID after soaking Patient not taking: Reported on 05/13/2017 11/18/16   Hyatt, Max T, DPM  pantoprazole (PROTONIX) 40 MG tablet Take 40 mg by mouth daily as needed.    [provider]  Simethicone (GAS-X PO) Take by mouth as needed.    [provider]  valACYclovir (VALTREX) 1000 MG tablet TAKE 2 TABLETS BY MOUTH TWICE A DAY FOR 1 DAY 10/07/17   Denita Lung, MD    Allergies Other; Nitrous oxide; Sulfa antibiotics; and Tessalon perles [benzonatate]    Social History Social History   Tobacco Use  . Smoking status: Current Every Day Smoker    Packs/day: 0.50    Years: 25.00    Pack years: 12.50    Types: Cigarettes  . Smokeless tobacco: Never Used  Substance Use Topics  . Alcohol use: Yes    Comment: 3-4 drinks a month  . Drug use: No    Review of Systems Patient denies headaches, rhinorrhea, blurry vision, numbness, shortness of breath, chest pain, edema, cough, abdominal pain, nausea, vomiting, diarrhea, dysuria, fevers, rashes or hallucinations unless otherwise stated above in HPI. ____________________________________________   PHYSICAL EXAM:  VITAL SIGNS: Vitals:   12/06/17 1443  BP: 124/85  Pulse: 85  Resp: 16  Temp: 98.2 F (36.8 C)  SpO2: 100%    Constitutional: Alert and oriented. Well appearing and in no acute distress. Eyes: Conjunctivae are normal.  Head: Atraumatic. Nose: No congestion/rhinnorhea. Mouth/Throat: Mucous membranes are moist.   Neck: No stridor. Painless ROM.  Cardiovascular: Normal rate, regular rhythm. Grossly normal heart sounds.  Good peripheral circulation. Respiratory: Normal respiratory effort.  No retractions. Lungs CTAB. Gastrointestinal: Soft and nontender. No distention. No abdominal bruits. No CVA tenderness.  Musculoskeletal: No lower  extremity tenderness nor edema.  No joint effusions. Neurologic:  Normal speech and language. No gross focal neurologic deficits are appreciated. No facial droop Skin:  Skin is warm, dry and intact. No rash noted. Psychiatric: Mood and affect are normal. Speech and behavior are normal.  ____________________________________________   LABS (all labs ordered are listed, but only abnormal results are displayed)  Results for orders placed or performed during the hospital encounter of 12/06/17 (from the past 24 hour(s))  Basic metabolic panel     Status: Abnormal   Collection Time: 12/06/17  2:49 PM  Result Value Ref Range   Sodium 135 135 - 145 mmol/L   Potassium 4.1 3.5 - 5.1 mmol/L   Chloride 104 101 - 111 mmol/L   CO2 23 22 - 32 mmol/L   Glucose, Bld 105 (H) 65 - 99 mg/dL   BUN  14 6 - 20 mg/dL   Creatinine, Ser 0.87 0.44 - 1.00 mg/dL   Calcium 9.6 8.9 - 10.3 mg/dL   GFR calc non Af Amer >60 >60 mL/min   GFR calc Af Amer >60 >60 mL/min   Anion gap 8 5 - 15  CBC     Status: None   Collection Time: 12/06/17  2:49 PM  Result Value Ref Range   WBC 8.4 3.6 - 11.0 K/uL   RBC 4.85 3.80 - 5.20 MIL/uL   Hemoglobin 14.2 12.0 - 16.0 g/dL   HCT 42.1 35.0 - 47.0 %   MCV 86.8 80.0 - 100.0 fL   MCH 29.2 26.0 - 34.0 pg   MCHC 33.6 32.0 - 36.0 g/dL   RDW 13.3 11.5 - 14.5 %   Platelets 219 150 - 440 K/uL  Troponin I     Status: None   Collection Time: 12/06/17  2:49 PM  Result Value Ref Range   Troponin I <0.03 <0.03 ng/mL   ____________________________________________  EKG My review and personal interpretation at Time: 14:40   Indication: 80  Rate: sinus  Rhythm: normal Axis: normal  Other: normal intervals, no stemi ____________________________________________  RADIOLOGY  I personally reviewed all radiographic images ordered to evaluate for the above acute complaints and reviewed radiology reports and findings.  These findings were personally discussed with the patient.  Please see  medical record for radiology report.  ____________________________________________   PROCEDURES  Procedure(s) performed:  Procedures    Critical Care performed: no ____________________________________________   INITIAL IMPRESSION / ASSESSMENT AND PLAN / ED COURSE  Pertinent labs & imaging results that were available during my care of the patient were reviewed by me and considered in my medical decision making (see chart for details).  DDX: ACS, pericarditis, esophagitis, boerhaaves, pe, dissection, pna, bronchitis, costochondritis   Stacy Mckinney is a 47 y.o. who presents to the ED with left-sided chest pain just under the breast as described above.  No rash.  No evidence of ACS patient is low risk heart score.  Patient is low risk by [place] and is PERC negative.  Not clinically consistent with dissection.  No evidence of pneumothorax or pneumonia.  Do suspect early shingles she has had similar episodes in the past.  Will treat with oral pain medications, steroids and give prescription for Valtrex to be filled if she does have a outbreak.  Have discussed with the patient and available family all diagnostics and treatments performed thus far and all questions were answered to the best of my ability. The patient demonstrates understanding and agreement with plan.       ____________________________________________   FINAL CLINICAL IMPRESSION(S) / ED DIAGNOSES  Final diagnoses:  Chest wall pain      NEW MEDICATIONS STARTED DURING THIS VISIT:  This SmartLink is deprecated. Use AVSMEDLIST instead to display the medication list for a patient.   Note:  This document was prepared using Dragon voice recognition software and may include unintentional dictation errors.    Merlyn Lot, MD 12/06/17 [phone]

## 2017-12-08 ENCOUNTER — Telehealth: Payer: Self-pay | Admitting: Physical Therapy

## 2017-12-08 NOTE — Telephone Encounter (Signed)
PT called pt to f/u on her report to the Urgent Care and needing to cancel her PT appt. 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 Pt is on medications for her shingles and is feeling so much better.Pt plans to come to her next appt

## 2017-12-19 ENCOUNTER — Ambulatory Visit: Payer: 59 | Attending: Obstetrics & Gynecology | Admitting: Physical Therapy

## 2017-12-19 DIAGNOSIS — M25571 Pain in right ankle and joints of right foot: Secondary | ICD-10-CM

## 2017-12-19 DIAGNOSIS — M6281 Muscle weakness (generalized): Secondary | ICD-10-CM

## 2017-12-19 DIAGNOSIS — M25572 Pain in left ankle and joints of left foot: Secondary | ICD-10-CM | POA: Insufficient documentation

## 2017-12-19 DIAGNOSIS — M533 Sacrococcygeal disorders, not elsewhere classified: Secondary | ICD-10-CM | POA: Insufficient documentation

## 2017-12-19 NOTE — Therapy (Signed)
[place] MAIN [facility] SERVICES [address] [place], [place], [address] Phone: [phone]   Fax:  [phone]  Physical Therapy Treatment [place] [facility] Note   Patient Details  Name: Stacy Mckinney MRN: [id number] Date of Birth: [birth year] Referring Provider: Dr. Vikki Ports Ward    Encounter Date: 12/19/2017  PT End of Session - 12/19/17 1312    Visit Number  10    Number of Visits     Date for PT Re-Evaluation  02/13/2018     PT Start Time  1005    PT Stop Time  1100    PT Time Calculation (min)  55 min    Activity Tolerance  Patient tolerated treatment well;No increased pain    Behavior During Therapy  WFL for tasks assessed/performed       Past Medical History:  Diagnosis Date  . Abdominal pain   . Anxiety   . Bipolar disorder ([place])   . Bladder disorder   . FHx: cardiovascular disease   . H/O ovarian cystectomy   . Heart murmur   . Hyperlipidemia   . Nausea   . Smoker   . Tobacco use disorder     Past Surgical History:  Procedure Laterality Date  . ABDOMINAL HYSTERECTOMY  05/23/2016  . CYSTOSCOPY  05/23/2017   Procedure: CYSTOSCOPY;  Surgeon: Ward, Honor Loh, MD;  Location: [facility] ORS;  Service: Gynecology;;  . ELBOW SURGERY     left elbow  . LAPAROSCOPIC APPENDECTOMY  10/23/2012   Procedure: APPENDECTOMY LAPAROSCOPIC;  Surgeon: Earnstine Regal, MD;  Location: [facility];  Service: General;  Laterality: N/A;  . LAPAROSCOPIC HYSTERECTOMY N/A 05/23/2017   Procedure: HYSTERECTOMY TOTAL LAPAROSCOPIC;  Surgeon: Ward, Honor Loh, MD;  Location: [facility] ORS;  Service: Gynecology;  Laterality: N/A;  . LAPAROSCOPIC OVARIAN CYSTECTOMY    . LAPAROSCOPIC UNILATERAL SALPINGECTOMY Left 05/23/2017   Procedure: LAPAROSCOPIC UNILATERAL SALPINGECTOMY;  Surgeon: Ward, Honor Loh, MD;  Location: [facility] ORS;  Service: Gynecology;  Laterality: Left;  . LAPAROSCOPIC UNILATERAL SALPINGO OOPHERECTOMY Right 05/23/2017   Procedure: LAPAROSCOPIC UNILATERAL SALPINGO  OOPHORECTOMY;  Surgeon: Ward, Honor Loh, MD;  Location: [facility] ORS;  Service: Gynecology;  Laterality: Right;  . LEEP    . RHINOPLASTY  age 47   due to broken nose  . TONSILLECTOMY     age 87    There were no vitals filed for this visit.  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 Pt went to the ER and was given medications which has resolved it. There is a tiny linger of the neuropathy and it has centralized to the L torso area. Pt took her dog to its tracking class, and she practiced dropping low and squatting to counteract the pull of her dog.  Pt felt tightness and sore one day afterward this activity. Pt still has trouble with vaccuming because it feels exhausting. Pt lost her job and will start using [employer] for insurance. Pt has had leakage with hard sneezing.  Her feet feel much better              [facility] PT Assessment - 12/19/17 1305      Lunges   Comments  subtalar eversion/ pronation on R, pain with cue to invert with R LE back in lunge, (decreased post Tx)  Withheld loaded CKC lunges due to occurence of ankle pain.  MOdified to seated with emphasis on R hip abduction  due to pronation. Requied excessive cues to initiate hip abduction on eccentric of plantarflexion       Palpation   Palpation comment  no tensions at feet, restored ankle ROM.                    [facility] PT Treatment/Exercise - 12/19/17 1308      Neuro Re-ed    Neuro Re-ed Details   see pt instructions, CKC for knee propioception and hip abduction B, walking lunges with red band 20 steps but stopped due to report of R ankle pain following cues for less prontation.   Body mechanics with cuing for vaccuuming, and box step technique in ZUMBA      Manual Therapy   Manual therapy comments  STM at tarsal tunnel R and peroneal  brevis. R                   PT Long Term Goals - 12/19/17 1317      PT LONG TERM GOAL  #1   Title  Pt will report no pain with driving across 20 min in order to commute     Time  12    Period  Weeks    Status  Achieved      PT LONG TERM GOAL #2   Title  Pt have decreased scar restrictions and be able to achieve increased sidebend L from 51.cm digit III to floor to 50 cm and rotation from 25% to > 45%  in order to stoop for training dogs     Time  12    Period  Weeks    Status  Achieved      PT LONG TERM GOAL #3   Title  Pt will be able to walk 2 hours, stoop with proper body mechanics, and twist body with co-activation of deep core mm in order to train dogs     Time  12    Period  Weeks    Status  Achieved      PT LONG TERM GOAL #4   Title  Pt will demo no lumbopelvic perturbations with deep core 2-3 for 5 reps in order to progress to upright deep core strengthening to resist the pull of his 70 lbs      Time  12    Period  Weeks    Status  Partially Met      PT LONG TERM GOAL #5   Title  Pt will be able to demo pulling 70 lbs with the cable column in order to minimize risk for injury when walking her dog     Time  12    Period  Weeks    Status  On-going      Additional Long Term Goals   Additional Long Term Goals  Yes      PT LONG TERM GOAL #6   Title  Pt will decrease her [place] score from 61% to < 50% in order to return to ADLS and improve QOL ( 10:23:  30%)     Time  12    Period  Weeks    Status  Achieved      PT LONG TERM GOAL #7   Title  Pt will demo increased L hip abduction 2/5 to 4-/5 and plantar flexion from 2 reps Grade 3/5 to 10 reps to transverse through uneven terrain with dogs      Time  12    Period  Weeks  Status  On-going      PT LONG TERM GOAL #8   Title  Pt will demo no tightness at plantar fascia B and more weightbearing in [place] and heels in bilateral stance in order to minimize feet pain and to have endurance with travels     Time  12    Period  Weeks    Status  Achieved      PT LONG TERM GOAL  #9   TITLE  Pt will report  less pronation/subtalar eversion on R and less genu valgus on R  with walking lunges during eccentric lowering of heel in order to minimize risk for falls     Time  4    Period  Weeks    Status  New    Target Date  01/16/18      PT LONG TERM GOAL  #10   TITLE  Pt will report no pain/exhaustion with vaccuuming her one room in her house in order to perform her chores    Time  8    Period  Weeks    Status  New    Target Date  02/13/18            Plan - 12/19/17 1313    Clinical Impression Statement Pt is progressing well with increased endurance and dynamic balance and has returned to training her dogs which was a major goal of hers. Pt has minimal pelvic pain and has remaining minor SUI issues with extremely strong sneezes. Pt continues to require skilled PT to continue  progressing to closed kinetic chain strengthening of ankles in order to minimize risk for falls and to return to fitness classes with less injury risks.   Today, pt showed decreased prioception to activate R glut medius and R pronation in walking lunges. Pt is not ready for full body weight loading due to pain at R ankle after performing walking lunges. Pain resolved following Tx. Substituted walking lunges with resistance to seated CKC strengthening.  Pt also progessed from yellow band to red band with open chain strengthening of R ankle. Pt showed good carry over across the past month with increased ankle mobility and strength. Pt continues to benefit from skilled PT to minimize risk for injuries related to B genu valgus,   return to ZUM[employer] classes with decreased risk for injuries, and to continue to improve SUI Sx   Rehab Potential  Good    PT Frequency  1x / week    PT Duration  12 weeks    PT Treatment/Interventions  Neuromuscular re-education;Cognitive remediation;Functional mobility training;Stair training;Gait training;Moist Heat;Aquatic Therapy;Therapeutic activities;Patient/family education;Therapeutic exercise;Manual  techniques;Scar mobilization;Taping    Consulted and Agree with Plan of Care  Patient       Patient will benefit from skilled therapeutic intervention in order to improve the following deficits and impairments:  Increased fascial restricitons, Postural dysfunction, Pain, Improper body mechanics, Decreased coordination, Decreased activity tolerance, Decreased safety awareness, Decreased strength, Decreased balance, Decreased endurance, Increased muscle spasms, Difficulty walking, Impaired sensation, Decreased scar mobility, Decreased range of motion, Decreased mobility  Visit Diagnosis: Muscle weakness (generalized)  Sacrococcygeal disorders, not elsewhere classified  Pain in right ankle and joints of right foot  Pain in left ankle and joints of left foot     Problem List Patient Active Problem List   Diagnosis Date Noted  . History of abnormal cervical Pap smear 04/01/2014  . Tobacco use disorder 01/25/2014  . Herpes labialis 08/04/2012  . Mood disorder ([place]) 08/04/2012  .  Hyperlipidemia with target low density lipoprotein (LDL) cholesterol less than 100 mg/dL 10/07/2011  . Family history of heart disease in female family member before age 69 10/07/2011    Jerl Mina ,PT, DPT, E-RYT  12/19/2017, 1:24 PM  [place] MAIN [facility] SERVICES [place], [place], [address] Phone: [phone]   Fax:  [phone]  Name: Stacy Mckinney MRN: [id number] Date of Birth: 22-Dec-1970

## 2017-12-28 ENCOUNTER — Ambulatory Visit: Payer: 59 | Admitting: Physical Therapy

## 2018-01-10 ENCOUNTER — Ambulatory Visit: Payer: 59 | Attending: Obstetrics & Gynecology | Admitting: Physical Therapy

## 2018-01-10 DIAGNOSIS — M6281 Muscle weakness (generalized): Secondary | ICD-10-CM | POA: Insufficient documentation

## 2018-01-10 DIAGNOSIS — M533 Sacrococcygeal disorders, not elsewhere classified: Secondary | ICD-10-CM | POA: Insufficient documentation

## 2018-01-10 DIAGNOSIS — M25572 Pain in left ankle and joints of left foot: Secondary | ICD-10-CM | POA: Insufficient documentation

## 2018-01-10 DIAGNOSIS — M25571 Pain in right ankle and joints of right foot: Secondary | ICD-10-CM | POA: Insufficient documentation

## 2018-01-25 ENCOUNTER — Ambulatory Visit: Payer: 59 | Admitting: Physical Therapy

## 2018-01-25 DIAGNOSIS — M25572 Pain in left ankle and joints of left foot: Secondary | ICD-10-CM

## 2018-01-25 DIAGNOSIS — M533 Sacrococcygeal disorders, not elsewhere classified: Secondary | ICD-10-CM | POA: Diagnosis present

## 2018-01-25 DIAGNOSIS — M25571 Pain in right ankle and joints of right foot: Secondary | ICD-10-CM | POA: Diagnosis present

## 2018-01-25 DIAGNOSIS — M6281 Muscle weakness (generalized): Secondary | ICD-10-CM | POA: Diagnosis not present

## 2018-01-25 NOTE — Patient Instructions (Signed)
Stretch for pelvic floors , esp after increased activities   "v heels slide away and then back toward buttocks and then rock knee to slight ,  slide heel along at 11 o clock away from buttocks   10 reps    ______  3 way taps Standing   _______  Knee to chest stretch for low back    _______ Stacy Mckinney stretch     ______  Strengthen L ankle up and out Red band 30 reps    _____ Modify half kneeling with dog training to alternate on both legs instead of only using L leg in the front    ____  Toning  [id number] for deep core engagement and relaxation

## 2018-01-26 NOTE — Therapy (Signed)
[place] MAIN [facility] SERVICES [address] [place], [place], [address] Phone: [phone]   Fax:  [phone]  Physical Therapy Treatment  Patient Details  Name: Stacy Mckinney MRN: [id number] Date of Birth: 1971-03-17 Referring Provider: Dr. Vikki Ports Ward    Encounter Date: 01/25/2018    Past Medical History:  Diagnosis Date  . Abdominal pain   . Anxiety   . Bipolar disorder ([place])   . Bladder disorder   . FHx: cardiovascular disease   . H/O ovarian cystectomy   . Heart murmur   . Hyperlipidemia   . Nausea   . Smoker   . Tobacco use disorder     Past Surgical History:  Procedure Laterality Date  . ABDOMINAL HYSTERECTOMY  05/23/2016  . CYSTOSCOPY  05/23/2017   Procedure: CYSTOSCOPY;  Surgeon: Ward, Honor Loh, MD;  Location: [facility];  Service: Gynecology;;  . ELBOW SURGERY     left elbow  . LAPAROSCOPIC APPENDECTOMY  10/23/2012   Procedure: APPENDECTOMY LAPAROSCOPIC;  Surgeon: Earnstine Regal, MD;  Location: [facility];  Service: General;  Laterality: N/A;  . LAPAROSCOPIC HYSTERECTOMY N/A 05/23/2017   Procedure: HYSTERECTOMY TOTAL LAPAROSCOPIC;  Surgeon: Ward, Honor Loh, MD;  Location: [facility];  Service: Gynecology;  Laterality: N/A;  . LAPAROSCOPIC OVARIAN CYSTECTOMY    . LAPAROSCOPIC UNILATERAL SALPINGECTOMY Left 05/23/2017   Procedure: LAPAROSCOPIC UNILATERAL SALPINGECTOMY;  Surgeon: Ward, Honor Loh, MD;  Location: [facility];  Service: Gynecology;  Laterality: Left;  . LAPAROSCOPIC UNILATERAL SALPINGO OOPHERECTOMY Right 05/23/2017   Procedure: LAPAROSCOPIC UNILATERAL SALPINGO OOPHORECTOMY;  Surgeon: Ward, Honor Loh, MD;  Location: [facility] OR[facility];  Service: Gynecology;  Laterality: Right;  . LEEP    . RHINOPLASTY  age 10   due to broken nose  . TONSILLECTOMY     age 6    There were no vitals filed for this visit.  Subjective Assessment - 01/26/18 1942    Subjective  Pt reports she has been doing alot walking. Pt tried wearing propioception  insoles and is getting to used to them. Pt noticed a difference when not wearing the insoles. Pt selected a new pair of dress shoes with good sole support. Today she feels low abdominal discomfort and tight after bending and half kneeling over 75x for the past two days when training her dog.  The incotinence is "very very rare".           [facility] PT Assessment - 01/26/18 1940      Floor to Stand   Comments  simulated training dog task" dominant L leg in hip /knee flexion, slight L trunk rotation to feed dog.  noted less difficulty with longer stride in half kneeling but with poor alignment       Strength   Overall Strength Comments  hip abd/glut /posterior sling  4+/5 B,  L eversion 4-/5, R eversion 4+/5.  PF standing 8 reps R, L 13 reps 4/5                  Pelvic Floor Special Questions - 01/26/18 1940    Pelvic Floor Internal Exam  pt was provided details of the exam, pt consented verbally without contraindication      Exam Type  Vaginal    Palpation    increased tightness/ tenderness R 11, 7 o'clock , referring cramping pain to R groin/low abdomen, and shooting pain to leg palpation at ishcial spine R anterior tilt w/ referred back pain  [place] Adult PT Treatment/Exercise - 01/26/18 1940      Neuro Re-ed    Neuro Re-ed Details   see pt instructions, CKC for knee propioception and hip abduction B, walking lunges with red band 20 steps but stopped due to report of R ankle pain following cues for less prontation       Moist Heat Therapy   Moist Heat Location  -- buttocks and perineal, abdomen       Manual Therapy   Internal Pelvic Floor  MWM, lower trunk rotation 10 deg, STM at probelm areas                   PT Long Term Goals - 01/25/18 0916      PT LONG TERM GOAL #1   Title  Pt will report no pain with driving across 20 min in order to commute     Time  12    Period  Weeks    Status  Achieved      PT LONG TERM GOAL #2   Title  Pt have decreased  scar restrictions and be able to achieve increased sidebend L from 51.cm digit III to floor to 50 cm and rotation from 25% to > 45%  in order to stoop for training dogs     Time  12    Period  Weeks    Status  Achieved      PT LONG TERM GOAL #3   Title  Pt will be able to walk 2 hours, stoop with proper body mechanics, and twist body with co-activation of deep core mm in order to train dogs     Time  12    Period  Weeks    Status  Achieved      PT LONG TERM GOAL #4   Title  Pt will demo no lumbopelvic perturbations with deep core 2-3 for 5 reps in order to progress to upright deep core strengthening to resist the pull of his 70 lbs      Time  12    Period  Weeks    Status  Partially Met      PT LONG TERM GOAL #5   Title  Pt will be able to demo pulling 70 lbs with the cable column in order to minimize risk for injury when walking her dog     Time  12    Period  Weeks    Status  On-going      PT LONG TERM GOAL #6   Title  Pt will decrease her Valliant score from 61% to < 50% in order to return to ADLS and improve QOL ( 10:23:  30%)     Time  12    Period  Weeks    Status  Achieved      PT LONG TERM GOAL #7   Title  Pt will demo increased L hip abduction 2/5 to 4-/5 and plantar flexion from 2 reps Grade 3/5 to 10 reps to transverse through uneven terrain with dogs      Time  12    Period  Weeks    Status  On-going      PT LONG TERM GOAL #8   Title  Pt will demo no tightness at plantar fascia B and more weightbearing in ballmound and heels in bilateral stance in order to minimize feet pain and to have endurance with travels     Time  12    Period  Weeks  Status  Achieved      PT LONG TERM GOAL  #9   TITLE  Pt will report less pronation/subtalar eversion on R and less genu valgus on R  with walking lunges during eccentric lowering of heel in order to minimize risk for falls     Time  4    Period  Weeks    Status  New      PT LONG TERM GOAL  #10   TITLE  Pt will report no  pain/exhaustion with vaccuuming her one room in her house in order to perform her chores    Time  8    Period  Weeks    Status  Achieved      PT LONG TERM GOAL  #11   TITLE  Pt will report no referred pain to R groin/ low back  with palpation at 7 and 11 o clock locations in pelvic floor in order to train her dogs with repeated half kneeling     Time  12    Period  Weeks    Status  New    Target Date  04/19/18            Plan - 01/26/18 1943    Clinical Impression Statement  Pt achieved 6/11 goals with decreased R abdominal and groin pain. Pt has increased her physical activity with training her dog with minimal pain, only tightness and discomfort the day after. Pt has increased her walking 3/4 miles once a day to twice a day across 3-4 x week.  Pt has shown improved hip and lower kinetic chain strength and proper alignment. However, pt showed increased pelvic floor mm tightness today after engaging in increased activities with repeated stand<> floor transitions. Pt showed decreased pelvic floor mm tightness post Tx. Pt continues to benefit from skilled PT to achieve remaining goals.      Rehab Potential  Good    PT Frequency  1x / week    PT Duration  12 weeks    PT Treatment/Interventions  Neuromuscular re-education;Cognitive remediation;Functional mobility training;Stair training;Gait training;Moist Heat;Aquatic Therapy;Therapeutic activities;Patient/family education;Therapeutic exercise;Manual techniques;Scar mobilization;Taping    Consulted and Agree with Plan of Care  Patient       Patient will benefit from skilled therapeutic intervention in order to improve the following deficits and impairments:  Increased fascial restricitons, Postural dysfunction, Pain, Improper body mechanics, Decreased coordination, Decreased activity tolerance, Decreased safety awareness, Decreased strength, Decreased balance, Decreased endurance, Increased muscle spasms, Difficulty walking, Impaired  sensation, Decreased scar mobility, Decreased range of motion, Decreased mobility  Visit Diagnosis: Muscle weakness (generalized)  Sacrococcygeal disorders, not elsewhere classified  Pain in right ankle and joints of right foot  Pain in left ankle and joints of left foot     Problem List Patient Active Problem List   Diagnosis Date Noted  . History of abnormal cervical Pap smear 04/01/2014  . Tobacco use disorder 01/25/2014  . Herpes labialis 08/04/2012  . Mood disorder ([place]) 08/04/2012  . Hyperlipidemia with target low density lipoprotein (LDL) cholesterol less than 100 mg/dL 10/07/2011  . Family history of heart disease in female family member before age 20 10/07/2011    Jerl Mina ,PT, DPT, E-RYT  01/26/2018, 7:44 PM  [place] MAIN [facility] SERVICES [address] [place], [place], [address] Phone: [phone]   Fax:  [phone]  Name: Stacy Mckinney MRN: [id number] Date of Birth: 1971/01/18

## 2018-02-06 ENCOUNTER — Ambulatory Visit: Payer: 59 | Attending: Obstetrics & Gynecology | Admitting: Physical Therapy

## 2018-02-06 DIAGNOSIS — M25571 Pain in right ankle and joints of right foot: Secondary | ICD-10-CM | POA: Diagnosis present

## 2018-02-06 DIAGNOSIS — M6281 Muscle weakness (generalized): Secondary | ICD-10-CM | POA: Diagnosis present

## 2018-02-06 DIAGNOSIS — M533 Sacrococcygeal disorders, not elsewhere classified: Secondary | ICD-10-CM | POA: Diagnosis present

## 2018-02-06 DIAGNOSIS — M25572 Pain in left ankle and joints of left foot: Secondary | ICD-10-CM | POA: Insufficient documentation

## 2018-02-06 NOTE — Therapy (Signed)
[place] MAIN [facility] SERVICES [address] [place], [place], [address] Phone: [phone]   Fax:  [phone]  Physical Therapy Treatment  Patient Details  Name: Stacy Mckinney MRN: [id number] Date of Birth: 1970-12-29 Referring Provider: Dr. Vikki Ports Ward    Encounter Date: 02/06/2018  PT End of Session - 02/06/18 1248    Visit Number  12    Date for PT Re-Evaluation  04/19/18    PT Start Time  4765    PT Stop Time  0958    PT Time Calculation (min)  63 min    Activity Tolerance  Patient tolerated treatment well;No increased pain    Behavior During Therapy  WFL for tasks assessed/performed       Past Medical History:  Diagnosis Date  . Abdominal pain   . Anxiety   . Bipolar disorder ([place])   . Bladder disorder   . FHx: cardiovascular disease   . H/O ovarian cystectomy   . Heart murmur   . Hyperlipidemia   . Nausea   . Smoker   . Tobacco use disorder     Past Surgical History:  Procedure Laterality Date  . ABDOMINAL HYSTERECTOMY  05/23/2016  . CYSTOSCOPY  05/23/2017   Procedure: CYSTOSCOPY;  Surgeon: Ward, Honor Loh, MD;  Location: [facility];  Service: Gynecology;;  . ELBOW SURGERY     left elbow  . LAPAROSCOPIC APPENDECTOMY  10/23/2012   Procedure: APPENDECTOMY LAPAROSCOPIC;  Surgeon: Earnstine Regal, MD;  Location: [facility];  Service: General;  Laterality: N/A;  . LAPAROSCOPIC HYSTERECTOMY N/A 05/23/2017   Procedure: HYSTERECTOMY TOTAL LAPAROSCOPIC;  Surgeon: Ward, Honor Loh, MD;  Location: [facility];  Service: Gynecology;  Laterality: N/A;  . LAPAROSCOPIC OVARIAN CYSTECTOMY    . LAPAROSCOPIC UNILATERAL SALPINGECTOMY Left 05/23/2017   Procedure: LAPAROSCOPIC UNILATERAL SALPINGECTOMY;  Surgeon: Ward, Honor Loh, MD;  Location: [facility];  Service: Gynecology;  Laterality: Left;  . LAPAROSCOPIC UNILATERAL SALPINGO OOPHERECTOMY Right 05/23/2017   Procedure: LAPAROSCOPIC UNILATERAL SALPINGO OOPHORECTOMY;  Surgeon: Ward, Honor Loh, MD;   Location: [facility];  Service: Gynecology;  Laterality: Right;  . LEEP    . RHINOPLASTY  age 89   due to broken nose  . TONSILLECTOMY     age 47    There were no vitals filed for this visit.  Subjective Assessment - 02/06/18 0906    Subjective  Pt reported she feels exhausted after 10+miles of hiking/ walking/ trotting with dog on a leash. Pt did not get any pain in her abdominal area, only tenderness.  Pt almost fell twice but she caught herself.  Pt states she has been prone to falls when going down the last 2-3 steps on stairs but she has not fallen to ground as much since PT and she is able to catch herself and stabilize more.  Pt states her urination flow is a dribble, dribble. Pt had minor leakage with hard sneeze          [facility] PT Assessment - 02/06/18 0914      Strength   Overall Strength Comments  hip abd B 5/5, ankle eversion/DF 4/5 B       Palpation   SI assessment   R ASIS more inferior / posterior . more levelled post Tx. 84 cm medial malleoli to ASIS bilaterally    Palpation comment  increased hypomobility at R navicular joint and  R abductor digiti minimi,        Ambulation/Gait   Stair Management Technique  --  poor propioception, push off/ eccentric control                   [facility] Adult PT Treatment/Exercise - 02/06/18 0955      Neuro Re-ed    Neuro Re-ed Details   see pt instructions, stair training with better eccentric control       Manual Therapy   Manual therapy comments  AP/PA mob at navicular, STM abductor digiti minimi with AP/PA mob at 5th ray on R,  PA mob at L sacrum               PT Education - 02/06/18 1253    Education provided  Yes    Education Details  HEP    Person(s) Educated  Patient    Methods  Explanation    Comprehension  Verbalized understanding;Returned demonstration;Verbal cues required;Tactile cues required          PT Long Term Goals - 01/25/18 0916      PT LONG TERM GOAL #1   Title  Pt will report no pain  with driving across 20 min in order to commute     Time  12    Period  Weeks    Status  Achieved      PT LONG TERM GOAL #2   Title  Pt have decreased scar restrictions and be able to achieve increased sidebend L from 51.cm digit III to floor to 50 cm and rotation from 25% to > 45%  in order to stoop for training dogs     Time  12    Period  Weeks    Status  Achieved      PT LONG TERM GOAL #3   Title  Pt will be able to walk 2 hours, stoop with proper body mechanics, and twist body with co-activation of deep core mm in order to train dogs     Time  12    Period  Weeks    Status  Achieved      PT LONG TERM GOAL #4   Title  Pt will demo no lumbopelvic perturbations with deep core 2-3 for 5 reps in order to progress to upright deep core strengthening to resist the pull of his 70 lbs      Time  12    Period  Weeks    Status  Partially Met      PT LONG TERM GOAL #5   Title  Pt will be able to demo pulling 70 lbs with the cable column in order to minimize risk for injury when walking her dog     Time  12    Period  Weeks    Status  On-going      PT LONG TERM GOAL #6   Title  Pt will decrease her Marion score from 61% to < 50% in order to return to ADLS and improve QOL ( 10:23:  30%)     Time  12    Period  Weeks    Status  Achieved      PT LONG TERM GOAL #7   Title  Pt will demo increased L hip abduction 2/5 to 4-/5 and plantar flexion from 2 reps Grade 3/5 to 10 reps to transverse through uneven terrain with dogs      Time  12    Period  Weeks    Status  On-going      PT LONG TERM GOAL #8   Title  Pt will  demo no tightness at plantar fascia B and more weightbearing in ballmound and heels in bilateral stance in order to minimize feet pain and to have endurance with travels     Time  12    Period  Weeks    Status  Achieved      PT LONG TERM GOAL  #9   TITLE  Pt will report less pronation/subtalar eversion on R and less genu valgus on R  with walking lunges during eccentric  lowering of heel in order to minimize risk for falls     Time  4    Period  Weeks    Status  ongoing     PT LONG TERM GOAL  #10   TITLE  Pt will report no pain/exhaustion with vaccuuming her one room in her house in order to perform her chores    Time  8    Period  Weeks    Status  Achieved      PT LONG TERM GOAL  #11   TITLE  Pt will report no referred pain to R groin/ low back  with palpation at 7 and 11 o clock locations in pelvic floor in order to train her dogs with repeated half kneeling     Time  12    Period  Weeks    Status Ongoing   Target Date             Plan - 02/06/18 1253    Clinical Impression Statement  Pt is progressing well with significantly increased endurance and without abdominal/groin pain while dog training over uneven terrain across 10+miles total over the weekend .  Pt showed L SIJ hypomobility today with more aligned pelvic girdle post Tx. Pt showed increased R intrinsic feet mm tightness with hypomobility on R midfoot joints which improved post Tx. Pt shows good carry over with overall ankle ROM ad strength. Initiated stair training to improve eccentric control of lower kinetic chain to minimize risks of falls. Pt is reporting being able to catch herself from falling to the ground which is an improvement with her improved feet issues. Pt continues to benefit from an interregional dependant approach to achieve remaining goals. Pt was provided education about the association of OSA and nocturia as pt reports snoring and nocturia. Plan to discuss further at upcoming sessions. Pt continues to benefit from skilled PT.     Rehab Potential  Good    PT Frequency  1x / week    PT Duration  12 weeks    PT Treatment/Interventions  Neuromuscular re-education;Cognitive remediation;Functional mobility training;Stair training;Gait training;Moist Heat;Aquatic Therapy;Therapeutic activities;Patient/family education;Therapeutic exercise;Manual techniques;Scar  mobilization;Taping    Consulted and Agree with Plan of Care  Patient       Patient will benefit from skilled therapeutic intervention in order to improve the following deficits and impairments:  Increased fascial restricitons, Postural dysfunction, Pain, Improper body mechanics, Decreased coordination, Decreased activity tolerance, Decreased safety awareness, Decreased strength, Decreased balance, Decreased endurance, Increased muscle spasms, Difficulty walking, Impaired sensation, Decreased scar mobility, Decreased range of motion, Decreased mobility  Visit Diagnosis: Muscle weakness (generalized)  Sacrococcygeal disorders, not elsewhere classified  Pain in right ankle and joints of right foot  Pain in left ankle and joints of left foot     Problem List Patient Active Problem List   Diagnosis Date Noted  . History of abnormal cervical Pap smear 04/01/2014  . Tobacco use disorder 01/25/2014  . Herpes labialis 08/04/2012  . Mood disorder ([place])  08/04/2012  . Hyperlipidemia with target low density lipoprotein (LDL) cholesterol less than 100 mg/dL 10/07/2011  . Family history of heart disease in female family member before age 54 10/07/2011    Jerl Mina ,PT, DPT, E-RYT  02/06/2018, 1:00 PM  [place] [address] [place], [place], [address] Phone: [phone]   Fax:  [phone]  Name: Emmery Seiler MRN: [id number] Date of Birth: Nov 04, 1971

## 2018-02-06 NOTE — Patient Instructions (Signed)
Steps:   Step up and lower down with slow control  10 reps each side   _____  Manage activities to avoid overdoing

## 2018-02-07 ENCOUNTER — Ambulatory Visit: Payer: 59 | Admitting: Physical Therapy

## 2018-02-21 ENCOUNTER — Ambulatory Visit: Payer: 59 | Admitting: Physical Therapy

## 2018-02-27 ENCOUNTER — Ambulatory Visit: Payer: 59 | Admitting: Physical Therapy

## 2018-03-13 ENCOUNTER — Ambulatory Visit: Payer: 59 | Attending: Obstetrics & Gynecology | Admitting: Physical Therapy

## 2018-03-13 DIAGNOSIS — M6281 Muscle weakness (generalized): Secondary | ICD-10-CM | POA: Diagnosis present

## 2018-03-13 DIAGNOSIS — M533 Sacrococcygeal disorders, not elsewhere classified: Secondary | ICD-10-CM

## 2018-03-13 DIAGNOSIS — M25572 Pain in left ankle and joints of left foot: Secondary | ICD-10-CM | POA: Diagnosis present

## 2018-03-13 DIAGNOSIS — M25571 Pain in right ankle and joints of right foot: Secondary | ICD-10-CM | POA: Diagnosis present

## 2018-03-13 NOTE — Therapy (Signed)
[place] MAIN [facility] SERVICES [address] [place], [place], [address] Phone: [phone]   Fax:  [phone]  Physical Therapy Treatment  Patient Details  Name: Stacy Mckinney MRN: [id number] Date of Birth: 1971-11-13 Referring Provider: Dr. Vikki Ports Ward    Encounter Date: 03/13/2018  PT End of Session - 03/13/18 0942    Visit Number  13    Date for PT Re-Evaluation  04/19/18    PT Start Time  0805    PT Stop Time  0903    PT Time Calculation (min)  58 min    Activity Tolerance  Patient tolerated treatment well;No increased pain    Behavior During Therapy  WFL for tasks assessed/performed       Past Medical History:  Diagnosis Date  . Abdominal pain   . Anxiety   . Bipolar disorder ([place])   . Bladder disorder   . FHx: cardiovascular disease   . H/O ovarian cystectomy   . Heart murmur   . Hyperlipidemia   . Nausea   . Smoker   . Tobacco use disorder     Past Surgical History:  Procedure Laterality Date  . ABDOMINAL HYSTERECTOMY  05/23/2016  . CYSTOSCOPY  05/23/2017   Procedure: CYSTOSCOPY;  Surgeon: Ward, Honor Loh, MD;  Location: [facility] ORS;  Service: Gynecology;;  . ELBOW SURGERY     left elbow  . LAPAROSCOPIC APPENDECTOMY  10/23/2012   Procedure: APPENDECTOMY LAPAROSCOPIC;  Surgeon: Earnstine Regal, MD;  Location: [facility];  Service: General;  Laterality: N/A;  . LAPAROSCOPIC HYSTERECTOMY N/A 05/23/2017   Procedure: HYSTERECTOMY TOTAL LAPAROSCOPIC;  Surgeon: Ward, Honor Loh, MD;  Location: [facility] ORS;  Service: Gynecology;  Laterality: N/A;  . LAPAROSCOPIC OVARIAN CYSTECTOMY    . LAPAROSCOPIC UNILATERAL SALPINGECTOMY Left 05/23/2017   Procedure: LAPAROSCOPIC UNILATERAL SALPINGECTOMY;  Surgeon: Ward, Honor Loh, MD;  Location: [facility] ORS;  Service: Gynecology;  Laterality: Left;  . LAPAROSCOPIC UNILATERAL SALPINGO OOPHERECTOMY Right 05/23/2017   Procedure: LAPAROSCOPIC UNILATERAL SALPINGO OOPHORECTOMY;  Surgeon: Ward, Honor Loh, MD;   Location: [facility] ORS;  Service: Gynecology;  Laterality: Right;  . LEEP    . RHINOPLASTY  age 47   due to broken nose  . TONSILLECTOMY     age 47   Physical Therapy Progress Note   Dates of reporting period  12/04/17   to 03/13/18  ( First Evaluation: 8 /6/19,  Last 12/04/17 progress note)   There were no vitals filed for this visit.  Subjective Assessment - 03/13/18 0809    Subjective  Pt had a extreme grab in the R abdominal area which has not happened in a long time. Her dog had pulled on the leash earlier that day at great force.  Pt feels overall she has been better. Her feet are the primary source achiness          [facility] PT Assessment - 03/13/18 0844      Squat   Comments  genu valgus, pronation       Strength   Overall Strength Comments  hip abd L 4-/5, R 5/5, hip ex 4-/5/5  . posterior sling  L hip ext/ R scaption 5/5,       Palpation   SI assessment   levelled pelvis     Palpation comment  no hypomobility in feet       Ambulation/Gait   Gait Comments  heel striking  Pelvic Floor Special Questions - 03/13/18 0845    Pelvic Floor Internal Exam  pt was provided details of the exam, pt consented verbally without contraindication      Exam Type  Vaginal    Palpation  Increased tenderness/ tightness at R iliococcygeus mm ( decreased with cue for co-activation of the hallucis abductor mm, extensor digitorium hallucis, longus .   anterior tilt w/ referred back pain        [facility] Adult PT Treatment/Exercise - 03/13/18 1423      Neuro Re-ed    Neuro Re-ed Details   see pt instructions,             PT Education - 03/13/18 1428    Education provided  Yes    Education Details  HEP    Person(s) Educated  Patient    Methods  Explanation;Demonstration;Tactile cues;Handout    Comprehension  Returned demonstration;Verbalized understanding;Verbal cues required;Tactile cues required          PT Long Term Goals - 03/13/18 3329      PT LONG TERM  GOAL #1   Title  Pt will report no pain with driving across 20 min in order to commute     Time  12    Period  Weeks    Status  Achieved      PT LONG TERM GOAL #2   Title  Pt have decreased scar restrictions and be able to achieve increased sidebend L from 51.cm digit III to floor to 50 cm and rotation from 25% to > 45%  in order to stoop for training dogs     Time  12    Period  Weeks    Status  Achieved      PT LONG TERM GOAL #3   Title  Pt will be able to walk 2 hours, stoop with proper body mechanics, and twist body with co-activation of deep core mm in order to train dogs     Time  12    Period  Weeks    Status  Achieved      PT LONG TERM GOAL #4   Title  Pt will demo no lumbopelvic perturbations with deep core 2-3 for 5 reps in order to progress to upright deep core strengthening to resist the pull of his 70 lbs      Time  12    Period  Weeks    Status  Achieved      PT LONG TERM GOAL #5   Title  Pt will be able to demo pulling 70 lbs with the cable column in order to minimize risk for injury when walking her dog     Time  12    Period  Weeks    Status  On-going      PT LONG TERM GOAL #6   Title  Pt will decrease her [place] score from 61% to < 50% in order to return to ADLS and improve QOL ( 10:23:  30%)     Time  12    Period  Weeks    Status  Achieved      PT LONG TERM GOAL #7   Title  Pt will demo increased L hip abduction 2/5 to 4-/5 and plantar flexion from 2 reps Grade 3/5 to 10 reps to transverse through uneven terrain with dogs  ( 03/13/18 : hip abd L 4-/5, R 5/5 13 reps B)      Time  12    Period  Weeks    Status  On-going      PT LONG TERM GOAL #8   Title  Pt will demo no tightness at plantar fascia B and more weightbearing in ballmound and heels in bilateral stance in order to minimize feet pain and to have endurance with travels     Time  12    Period  Weeks    Status  Achieved      PT LONG TERM GOAL  #9   TITLE  Pt will report less pronation/subtalar  eversion on R and less genu valgus on R  with walking lunges during eccentric lowering of heel in order to minimize risk for falls     Time  4    Period  Weeks    Status  On-going      PT LONG TERM GOAL  #10   TITLE  Pt will report no pain/exhaustion with vaccuuming her one room in her house in order to perform her chores    Time  8    Period  Weeks    Status  Achieved      PT LONG TERM GOAL  #11   TITLE  Pt will report no referred pain to R groin/ low back  with palpation at 7 and 11 o clock locations in pelvic floor in order to train her dogs with repeated half kneeling     Time  12    Period  Weeks    Status  On-going            Plan - 03/13/18 1428    Clinical Impression Statement Pt has achieved 7/11 goals since the last progress note 4 months ago. Pt has shown increased self-management and increased hip/ deep core strength and equal alignment of pelvic girdle across the past months as the frequency of PT sessions decreased. Pt also had less falls since [facility] nine months ago.  However, pt had one relapse of abdominal pelvic pain with sudden pulling of her dog on its leash. This was also associated with increased leakage. Assessment today showed signficantly decreased pelvic floor tightness and enhanced tightness/ tenderness with cue for co-activation of her transverse/ longitudinal arches in her feet. Given pt having feet issues and wearing of ankle brace as a child, pt showed significant genu valgus and pronation. Initiated neuromuscular re-education today for extension of toes to lift arches, hip abduction / supination/ inversion in closed kinetic chain. Pt continues to benefit from skilled PT to minimize relapse of pelvic pain/ risk for falls and optimize lower kinetic chain strengthening.      Rehab Potential  Good    PT Frequency  Monthly   PT Duration  12 weeks    PT Treatment/Interventions  Neuromuscular re-education;Cognitive remediation;Functional mobility training;Stair  training;Gait training;Moist Heat;Aquatic Therapy;Therapeutic activities;Patient/family education;Therapeutic exercise;Manual techniques;Scar mobilization;Taping    Consulted and Agree with Plan of Care  Patient       Patient will benefit from skilled therapeutic intervention in order to improve the following deficits and impairments:  Increased fascial restricitons, Postural dysfunction, Pain, Improper body mechanics, Decreased coordination, Decreased activity tolerance, Decreased safety awareness, Decreased strength, Decreased balance, Decreased endurance, Increased muscle spasms, Difficulty walking, Impaired sensation, Decreased scar mobility, Decreased range of motion, Decreased mobility  Visit Diagnosis: Muscle weakness (generalized)  Sacrococcygeal disorders, not elsewhere classified  Pain in right ankle and joints of right foot  Pain in left ankle and joints of left foot     Problem List Patient Active Problem List  Diagnosis Date Noted  . History of abnormal cervical Pap smear 04/01/2014  . Tobacco use disorder 01/25/2014  . Herpes labialis 08/04/2012  . Mood disorder ([place]) 08/04/2012  . Hyperlipidemia with target low density lipoprotein (LDL) cholesterol less than 100 mg/dL 10/07/2011  . Family history of heart disease in female family member before age 47 10/07/2011    Jerl Mina ,PT, DPT, E-RYT  03/13/2018, 2:52 PM  [place] MAIN [facility] SERVICES [place], [place], [address] Phone: [phone]   Fax:  [phone]  Name: Charm Stenner MRN: [id number] Date of Birth: 1970-12-10

## 2018-03-13 NOTE — Patient Instructions (Addendum)
Heel raises with heels together, with robber banded 30 reps x 3    Followed by calf stretches   ______  Seated deep core with red band at thighs, with all four corners of foot down. Toes extended to lift arches  Suction cup four corners of foot down, then move the knee out slight 5 deg   2 mins x 3    ______ Mini squats,  Suction cup four corners of foot down,with pillow cases folded in flag to place under ballmounds  Scoot butt back so knees are behind toes  Knees track in a V as you lower,  Exhale to stand, suction cup four corners of foot down 30 reps  x 3

## 2018-04-03 ENCOUNTER — Ambulatory Visit: Payer: 59 | Attending: Obstetrics & Gynecology | Admitting: Physical Therapy

## 2018-04-03 DIAGNOSIS — M6281 Muscle weakness (generalized): Secondary | ICD-10-CM | POA: Diagnosis present

## 2018-04-03 DIAGNOSIS — M533 Sacrococcygeal disorders, not elsewhere classified: Secondary | ICD-10-CM | POA: Insufficient documentation

## 2018-04-03 DIAGNOSIS — M25571 Pain in right ankle and joints of right foot: Secondary | ICD-10-CM

## 2018-04-03 DIAGNOSIS — M25572 Pain in left ankle and joints of left foot: Secondary | ICD-10-CM | POA: Diagnosis present

## 2018-04-03 NOTE — Patient Instructions (Addendum)
Heel raises with rolled towel squeezing the heels    _______    [name] WITH RESISTANCE  , several backs of chairs along the side for side rail  BLUE Band at waist connected to doorknob 62mins Stepping forward normal length steps, planting mid and forefoot down, center of mass ( navel) leans forward slightly as if you were walking uphill 3-4 steps till band feels taut ( MAKE SURE THE DOOR IS LOCKED AND WON'T OPEN)   Stepping backwards, lower heel slowly, carry trunk and hips back as you step  3 min    ______   Facing the wall,  hands on the wall  Tap back, on ballmound , keep heel up Navel forward, And be on ballmound/ mid foot for supporting leg, tracking knee in line with 1-2nd toes     _______   COMPLIMENTARY CALF and figure -4 stretches with these exercise   ______  Getting into bed, roll in sidelying

## 2018-04-03 NOTE — Therapy (Signed)
[place] MAIN [facility] SERVICES [address] [place], [place], [address] Phone: [phone]   Fax:  [phone]  Physical Therapy Treatment  Patient Details  Name: Stacy Mckinney MRN: [id number] Date of Birth: 24-Jul-1971 Referring Provider: Dr. Vikki Ports Ward    Encounter Date: 04/03/2018  PT End of Session - 04/03/18 0955    Visit Number  14    Date for PT Re-Evaluation  06/05/18    PT Start Time  0905    PT Stop Time  0955    PT Time Calculation (min)  50 min    Activity Tolerance  Patient tolerated treatment well;No increased pain    Behavior During Therapy  WFL for tasks assessed/performed       Past Medical History:  Diagnosis Date  . Abdominal pain   . Anxiety   . Bipolar disorder ([place])   . Bladder disorder   . FHx: cardiovascular disease   . H/O ovarian cystectomy   . Heart murmur   . Hyperlipidemia   . Nausea   . Smoker   . Tobacco use disorder     Past Surgical History:  Procedure Laterality Date  . ABDOMINAL HYSTERECTOMY  05/23/2016  . CYSTOSCOPY  05/23/2017   Procedure: CYSTOSCOPY;  Surgeon: Ward, Honor Loh, MD;  Location: [facility] ORS;  Service: Gynecology;;  . ELBOW SURGERY     left elbow  . LAPAROSCOPIC APPENDECTOMY  10/23/2012   Procedure: APPENDECTOMY LAPAROSCOPIC;  Surgeon: Earnstine Regal, MD;  Location: [facility];  Service: General;  Laterality: N/A;  . LAPAROSCOPIC HYSTERECTOMY N/A 05/23/2017   Procedure: HYSTERECTOMY TOTAL LAPAROSCOPIC;  Surgeon: Ward, Honor Loh, MD;  Location: [facility] ORS;  Service: Gynecology;  Laterality: N/A;  . LAPAROSCOPIC OVARIAN CYSTECTOMY    . LAPAROSCOPIC UNILATERAL SALPINGECTOMY Left 05/23/2017   Procedure: LAPAROSCOPIC UNILATERAL SALPINGECTOMY;  Surgeon: Ward, Honor Loh, MD;  Location: [facility] ORS;  Service: Gynecology;  Laterality: Left;  . LAPAROSCOPIC UNILATERAL SALPINGO OOPHERECTOMY Right 05/23/2017   Procedure: LAPAROSCOPIC UNILATERAL SALPINGO OOPHORECTOMY;  Surgeon: Ward, Honor Loh, MD;   Location: [facility] ORS;  Service: Gynecology;  Laterality: Right;  . LEEP    . RHINOPLASTY  age 47   due to broken nose  . TONSILLECTOMY     age 47    There were no vitals filed for this visit.  Subjective Assessment - 04/03/18 0908    Subjective  Pt worked out in the yard, dragging 40 lb of mulch and mulching 12 bags, and raking. Pt had some pain .  Pt had an almost fall but she was able to catch herself. Pt has not had any falls for 4-5 months and has been able to catch herself in multiple near falls.          [facility] PT Assessment - 04/03/18 1154      Floor to Stand   Comments  no genu valgus, less pronation       Other:   Other/ Comments  required excessive cues for plantarflexion/ inversion in new HEP that challenge a more anterior COM with forefoot/ midfoot co-activation      Strength   Overall Strength Comments  hip abd L 4/5, R 5/5, hip ext R 3+/5 with pain at R SIJ, 4/5                 Pelvic Floor Special Questions - 04/03/18 1203    External Perineal Exam  through clothing, tightness/ tenderness reported at coccygeus/ ischiocavernosus R  [facility] Adult PT Treatment/Exercise - 04/03/18 1158      Neuro Re-ed    Neuro Re-ed Details   see pt instructions,             PT Education - 04/03/18 1001    Education provided  Yes    Education Details  HEP     Person(s) Educated  Patient    Methods  Explanation;Demonstration;Tactile cues;Verbal cues;Handout    Comprehension  Returned demonstration;Verbalized understanding          PT Long Term Goals - 04/03/18 3254      PT LONG TERM GOAL #1   Title  Pt will report no pain with driving across 20 min in order to commute     Time  12    Period  Weeks    Status  Achieved      PT LONG TERM GOAL #2   Title  Pt have decreased scar restrictions and be able to achieve increased sidebend L from 51.cm digit III to floor to 50 cm and rotation from 25% to > 45%  in order to stoop for training dogs     Time   12    Period  Weeks    Status  Achieved      PT LONG TERM GOAL #3   Title  Pt will be able to walk 2 hours, stoop with proper body mechanics, and twist body with co-activation of deep core mm in order to train dogs     Time  12    Period  Weeks    Status  Achieved      PT LONG TERM GOAL #4   Title  Pt will demo no lumbopelvic perturbations with deep core 2-3 for 5 reps in order to progress to upright deep core strengthening to resist the pull of his 70 lbs      Time  12    Period  Weeks    Status  Achieved      PT LONG TERM GOAL #5   Title  Pt will be able to demo pulling 70 lbs with the cable column in order to minimize risk for injury when walking her dog     Time  12    Period  Weeks    Status  On-going      Additional Long Term Goals   Additional Long Term Goals  Yes      PT LONG TERM GOAL #6   Title  Pt will decrease her [place] score from 61% to < 50% in order to return to ADLS and improve QOL ( 10:23:  30%)     Time  12    Period  Weeks    Status  Achieved      PT LONG TERM GOAL #7   Title  Pt will demo increased L hip abduction 2/5 to 4-/5 and plantar flexion from 2 reps Grade 3/5 to 10 reps to transverse through uneven terrain with dogs  ( 03/13/18 : hip abd L 4-/5, R 5/5 13 reps B) ( 5/5: hip abd 4-/5 B, PF reps Grade 3/5         Time  12    Period  Weeks    Status  Achieved      PT LONG TERM GOAL #8   Title  Pt will demo no tightness at plantar fascia B and more weightbearing in ballmound and heels in bilateral stance in order to minimize feet pain and to have endurance  with travels     Time  12    Period  Weeks    Status  Achieved      PT LONG TERM GOAL  #9   TITLE  Pt will report less pronation/subtalar eversion on R and less genu valgus on R  with walking lunges during eccentric lowering of heel in order to minimize risk for falls     Time  4    Period  Weeks    Status  Achieved      PT LONG TERM GOAL  #10   TITLE  Pt will report no pain/exhaustion with  vaccuuming her one room in her house in order to perform her chores    Time  8    Period  Weeks    Status  Achieved      PT LONG TERM GOAL  #11   TITLE  Pt will report no referred pain to R groin/ low back  with palpation at 7 and 11 o clock locations in pelvic floor in order to train her dogs with repeated half kneeling     Time  12    Period  Weeks    Status  Partially Met      PT LONG TERM GOAL  #12   TITLE  Pt will demo increased R hip ext strength from 3/5 to > 4/5 in order to perform squats to lift luggage when traveling     Time  8    Period  Weeks    Status  New    [place] Date  05/29/18            Plan - 04/03/18 0955    Clinical Impression Statement  Pt continues to make progress with decreased pelvic pain but requires days of rest following increased physical activities ( i.e. mulching, pulling 40 lb bags, raking). Pt's ankle stability and feet issues are also improving as  pt has not had any falls for 4-5 months and has been able to catch herself in multiple near falls. However, pt today showed minor pelvic floor tightness which pt was able to decrease with stretches. Pt also showed excessive cues for plantarflexion/ inversion activiation in new HEP that challenged her propioception of a more anterior COM and more weightbearing on mid/forefoot. Pt demo'd less genu valgus/ pronation with training.  Pt required rest breaks with new closed kinetic chain exercises. Pt reported soreness at the medial aspect of R ankle post session. Pt was advised to rest with ice and resume exercises next day at lower reps. Pt showed significantly weaknes hip ext strength on her R which will require further strength training to mitigate lower kinetic chain deficits. . Pt will remain on once a month frequency to ensure abiltiy to self-manage and progress with strengthening. Pt continues to benefit from skilled PT.      Rehab Potential  Good    PT Frequency  [place]    PT Duration  12 weeks    PT  Treatment/Interventions  Neuromuscular re-education;Cognitive remediation;Functional mobility training;Stair training;Gait training;Moist Heat;Aquatic Therapy;Therapeutic activities;Patient/family education;Therapeutic exercise;Manual techniques;Scar mobilization;Taping    Consulted and Agree with Plan of Care  Patient       Patient will benefit from skilled therapeutic intervention in order to improve the following deficits and impairments:  Increased fascial restricitons, Postural dysfunction, Pain, Improper body mechanics, Decreased coordination, Decreased activity tolerance, Decreased safety awareness, Decreased strength, Decreased balance, Decreased endurance, Increased muscle spasms, Difficulty walking, Impaired sensation, Decreased scar mobility, Decreased range  of motion, Decreased mobility  Visit Diagnosis: Muscle weakness (generalized)  Sacrococcygeal disorders, not elsewhere classified  Pain in right ankle and joints of right foot  Pain in left ankle and joints of left foot     Problem List Patient Active Problem List   Diagnosis Date Noted  . History of abnormal cervical Pap smear 04/01/2014  . Tobacco use disorder 01/25/2014  . Herpes labialis 08/04/2012  . Mood disorder ([place]) 08/04/2012  . Hyperlipidemia with target low density lipoprotein (LDL) cholesterol less than 100 mg/dL 10/07/2011  . Family history of heart disease in female family member before age 61 10/07/2011    Jerl Mina ,PT, DPT, E-RYT  04/03/2018, 12:13 PM  [place] [address] [place], [place], [address] Phone: [phone]   Fax:  [phone]  Name: Iylah Dworkin MRN: [id number] Date of Birth: Oct 23, 1971

## 2018-11-20 ENCOUNTER — Other Ambulatory Visit: Payer: Self-pay

## 2018-11-20 ENCOUNTER — Encounter: Payer: Self-pay | Admitting: Emergency Medicine

## 2018-11-20 ENCOUNTER — Ambulatory Visit
Admission: EM | Admit: 2018-11-20 | Discharge: 2018-11-20 | Disposition: A | Discharge status: Home / Self Care | Payer: 59 | Attending: Family Medicine | Admitting: Family Medicine

## 2018-11-20 DIAGNOSIS — B0229 Other postherpetic nervous system involvement: Secondary | ICD-10-CM | POA: Diagnosis not present

## 2018-11-20 DIAGNOSIS — G629 Polyneuropathy, unspecified: Secondary | ICD-10-CM | POA: Insufficient documentation

## 2018-11-20 MED ORDER — PREDNISONE 20 MG PO TABS: ORAL_TABLET | ORAL | 0 refills | Status: DC

## 2018-11-20 NOTE — ED Provider Notes (Signed)
[facility]    CSN: [id number] Arrival date & time: 11/20/18  1035     History   Chief Complaint Chief Complaint  Patient presents with  . Peripheral Neuropathy  . Herpes Zoster    HPI Stacy Mckinney is a 47 y.o. female.   47 yo female with a c/o recent shingles and post herpetic neuralgia/neuropathy. States she's had this in the past and recently started gabapentin. Has also taken valtrex. States in the past short prednisone course has also helped. Denies any fevers, chills.      Past Medical History:  Diagnosis Date  . Abdominal pain   . Anxiety   . Bipolar disorder ([place])   . Bladder disorder   . FHx: cardiovascular disease   . H/O ovarian cystectomy   . Heart murmur   . Hyperlipidemia   . Nausea   . Smoker   . Tobacco use disorder     Patient Active Problem List   Diagnosis Date Noted  . History of abnormal cervical Pap smear 04/01/2014  . Tobacco use disorder 01/25/2014  . Herpes labialis 08/04/2012  . Mood disorder ([place]) 08/04/2012  . Hyperlipidemia with target low density lipoprotein (LDL) cholesterol less than 100 mg/dL 10/07/2011  . Family history of heart disease in female family member before age 66 10/07/2011    Past Surgical History:  Procedure Laterality Date  . ABDOMINAL HYSTERECTOMY  05/23/2016  . CYSTOSCOPY  05/23/2017   Procedure: CYSTOSCOPY;  Surgeon: Ward, Honor Loh, MD;  Location: [facility] OR[facility];  Service: Gynecology;;  . ELBOW SURGERY     left elbow  . LAPAROSCOPIC APPENDECTOMY  10/23/2012   Procedure: APPENDECTOMY LAPAROSCOPIC;  Surgeon: Earnstine Regal, MD;  Location: [facility];  Service: General;  Laterality: N/A;  . LAPAROSCOPIC HYSTERECTOMY N/A 05/23/2017   Procedure: HYSTERECTOMY TOTAL LAPAROSCOPIC;  Surgeon: Ward, Honor Loh, MD;  Location: [facility];  Service: Gynecology;  Laterality: N/A;  . LAPAROSCOPIC OVARIAN CYSTECTOMY    . LAPAROSCOPIC UNILATERAL SALPINGECTOMY Left 05/23/2017   Procedure: LAPAROSCOPIC UNILATERAL SALPINGECTOMY;   Surgeon: Ward, Honor Loh, MD;  Location: [facility];  Service: Gynecology;  Laterality: Left;  . LAPAROSCOPIC UNILATERAL SALPINGO OOPHERECTOMY Right 05/23/2017   Procedure: LAPAROSCOPIC UNILATERAL SALPINGO OOPHORECTOMY;  Surgeon: Ward, Honor Loh, MD;  Location: [facility];  Service: Gynecology;  Laterality: Right;  . LEEP    . RHINOPLASTY  age 72   due to broken nose  . TONSILLECTOMY     age 63    OB History   No obstetric history on file.      Home Medications    Prior to Admission medications   Medication Sig Start Date End Date Taking? Authorizing Provider  Coenzyme Q10 (COQ-10) 400 MG CAPS Take 400 mg by mouth every 7 (seven) days.    Yes [provider]  gabapentin (NEURONTIN) 300 MG capsule Take 600 mg by mouth at bedtime.   Yes [provider]  LAMICTAL 200 MG tablet TAKE 1 TABLET BY MOUTH  DAILY 10/20/16  Yes Denita Lung, MD  valACYclovir (VALTREX) 1000 MG tablet TAKE 2 TABLETS BY MOUTH TWICE A DAY FOR 1 DAY 10/07/17  Yes Denita Lung, MD  diazepam (VALIUM) 2 MG tablet TAKE 1 TABLET BY MOUTH EVERY 6 HOURS AS NEEDED FOR ANXIETY 05/19/17   Denita Lung, MD  fluticasone [facility]) 50 MCG/ACT nasal spray Place 1 spray into both nostrils daily as needed for allergies or rhinitis.    [provider]  meloxicam (MOBIC) 15  MG tablet Take 1 tablet (15 mg total) by mouth daily. Patient not taking: Reported on 07/14/2017 03/18/17   Hyatt, Max T, DPM  naproxen sodium (ANAPROX) 220 MG tablet Take 220 mg by mouth 2 (two) times daily as needed.    [provider]  NEOMYCIN-POLYMYXIN-HYDROCORTISONE (CORTISPORIN) 1 % SOLN otic solution Apply 1-2 drops to toe BID after soaking Patient not taking: Reported on 05/13/2017 11/18/16   Hyatt, Max T, DPM  pantoprazole (PROTONIX) 40 MG tablet Take 40 mg by mouth daily as needed.    [provider]  predniSONE (DELTASONE) 20 MG tablet 3 tabs po qd x 2 days, then 2 tabs po qd x 2 days, then 1 tab po qd x 2 days,  then half a tab po qd x 2 days 11/20/18   Norval Gable, MD  Simethicone (GAS-X PO) Take by mouth as needed.    [provider]  traMADol (ULTRAM) 50 MG tablet Take 1 tablet (50 mg total) by mouth every 6 (six) hours as needed. 12/06/17 12/06/18  Merlyn Lot, MD    Family History Family History  Problem Relation Age of Onset  . Heart disease Father   . Hypertension Father   . Stroke Maternal Grandmother   . Cancer Maternal Grandmother        ovarian  . Stroke Paternal Grandmother   . Depression Paternal Grandmother   . Cancer Paternal Grandmother        breast, ovarian  . Breast cancer Paternal Grandmother   . Cancer Maternal Aunt        ovarian  . Stroke Maternal Grandfather   . Stroke Paternal Grandfather     Social History Social History   Tobacco Use  . Smoking status: Current Every Day Smoker    Packs/day: 0.50    Years: 25.00    Pack years: 12.50    Types: Cigarettes  . Smokeless tobacco: Never Used  Substance Use Topics  . Alcohol use: Yes    Comment: 3-4 drinks a month  . Drug use: No     Allergies   Other; Nitrous oxide; Sulfa antibiotics; and Tessalon perles [benzonatate]   Review of Systems Review of Systems   Physical Exam Triage Vital Signs ED Triage Vitals  Enc Vitals Group     BP 11/20/18 1051 (!) 137/99     Pulse Rate 11/20/18 1051 78     Resp 11/20/18 1051 16     Temp 11/20/18 1051 97.7 F (36.5 C)     Temp Source 11/20/18 1051 Oral     SpO2 11/20/18 1051 100 %     Weight 11/20/18 1047 200 lb (90.7 kg)     Height 11/20/18 1047 5\' 4"  (1.626 m)     Head Circumference --      Peak Flow --      Pain Score 11/20/18 1047 6     Pain Loc --      Pain Edu? --      Excl. in [place]? --    No data found.  Updated Vital Signs BP (!) 137/99 (BP Location: Right Arm)   Pulse 78   Temp 97.7 F (36.5 C) (Oral)   Resp 16   Ht 5\' 4"  (1.626 m)   Wt 90.7 kg   LMP  (LMP Unknown)   SpO2 100%   BMI 34.33 kg/m   Visual Acuity Right  Eye Distance:   Left Eye Distance:   Bilateral Distance:    Right Eye Near:  Left Eye Near:    Bilateral Near:     Physical Exam Vitals signs and nursing note reviewed.  Constitutional:      General: She is not in acute distress.    Appearance: She is not toxic-appearing or diaphoretic.  Skin:    Comments: Few dried skin lesions on chest wall  Neurological:     Mental Status: She is alert.      UC Treatments / Results  Labs (all labs ordered are listed, but only abnormal results are displayed) Labs Reviewed - No data to display  EKG None  Radiology No results found.  Procedures Procedures (including critical care time)  Medications Ordered in UC Medications - No data to display  Initial Impression / Assessment and Plan / UC Course  I have reviewed the triage vital signs and the nursing notes.  Pertinent labs & imaging results that were available during my care of the patient were reviewed by me and considered in my medical decision making (see chart for details).      Final Clinical Impressions(s) / UC Diagnoses   Final diagnoses:  Neuropathy  Post herpetic neuralgia    ED Prescriptions    Medication Sig Dispense Auth. Provider   predniSONE (DELTASONE) 20 MG tablet 3 tabs po qd x 2 days, then 2 tabs po qd x 2 days, then 1 tab po qd x 2 days, then half a tab po qd x 2 days 13 tablet Sheri Gatchel, MD        1. diagnosis reviewed with patient 2. rx as per orders above; reviewed possible side effects, interactions, risks and benefits  3. Recommend continue other current medications 4. Follow-up prn if symptoms worsen or don't improve    Controlled Substance Prescriptions [place] Controlled Substance Registry consulted? Not Applicable   Norval Gable, MD 11/20/18 1202

## 2018-11-20 NOTE — ED Triage Notes (Signed)
Patient states that she developed Shingles 5 days ago.  Patient states that her neuropathy pain started getting worse.  Patient states that she has already started the Valacyclovir.

## 2018-11-28 ENCOUNTER — Other Ambulatory Visit: Payer: Self-pay | Admitting: Family Medicine

## 2018-11-28 DIAGNOSIS — Z1231 Encounter for screening mammogram for malignant neoplasm of breast: Secondary | ICD-10-CM

## 2018-12-14 ENCOUNTER — Ambulatory Visit
Admission: RE | Admit: 2018-12-14 | Discharge: 2018-12-14 | Disposition: A | Discharge status: Home / Self Care | Payer: 59 | Source: Ambulatory Visit | Attending: Family Medicine | Admitting: Family Medicine

## 2018-12-14 DIAGNOSIS — Z1231 Encounter for screening mammogram for malignant neoplasm of breast: Secondary | ICD-10-CM | POA: Diagnosis not present

## 2018-12-18 ENCOUNTER — Other Ambulatory Visit: Payer: Self-pay | Admitting: Family Medicine

## 2018-12-18 DIAGNOSIS — R928 Other abnormal and inconclusive findings on diagnostic imaging of breast: Secondary | ICD-10-CM

## 2018-12-18 DIAGNOSIS — N6489 Other specified disorders of breast: Secondary | ICD-10-CM

## 2019-01-05 ENCOUNTER — Ambulatory Visit
Admission: RE | Admit: 2019-01-05 | Discharge: 2019-01-05 | Disposition: A | Discharge status: Home / Self Care | Payer: 59 | Source: Ambulatory Visit | Attending: Family Medicine | Admitting: Family Medicine

## 2019-01-05 DIAGNOSIS — R928 Other abnormal and inconclusive findings on diagnostic imaging of breast: Secondary | ICD-10-CM | POA: Diagnosis present

## 2019-01-05 DIAGNOSIS — N6489 Other specified disorders of breast: Secondary | ICD-10-CM | POA: Diagnosis present

## 2019-01-09 ENCOUNTER — Other Ambulatory Visit: Payer: Self-pay | Admitting: Family Medicine

## 2019-01-09 DIAGNOSIS — N6002 Solitary cyst of left breast: Secondary | ICD-10-CM

## 2019-05-31 ENCOUNTER — Encounter: Payer: Self-pay | Admitting: Psychiatry

## 2019-05-31 ENCOUNTER — Other Ambulatory Visit: Payer: Self-pay

## 2019-05-31 ENCOUNTER — Ambulatory Visit (INDEPENDENT_AMBULATORY_CARE_PROVIDER_SITE_OTHER): Payer: 59 | Admitting: Psychiatry

## 2019-05-31 DIAGNOSIS — F17200 Nicotine dependence, unspecified, uncomplicated: Secondary | ICD-10-CM

## 2019-05-31 DIAGNOSIS — F3181 Bipolar II disorder: Secondary | ICD-10-CM | POA: Insufficient documentation

## 2019-05-31 DIAGNOSIS — F633 Trichotillomania: Secondary | ICD-10-CM

## 2019-05-31 DIAGNOSIS — R102 Pelvic and perineal pain: Secondary | ICD-10-CM | POA: Insufficient documentation

## 2019-05-31 DIAGNOSIS — F410 Panic disorder [episodic paroxysmal anxiety] without agoraphobia: Secondary | ICD-10-CM | POA: Insufficient documentation

## 2019-05-31 MED ORDER — LAMICTAL 200 MG PO TABS: 200.0000 mg | ORAL_TABLET | Freq: Every day | ORAL | 2 refills | Status: DC
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
## 2019-07-24 ENCOUNTER — Telehealth: Payer: Self-pay

## 2019-07-24 NOTE — Telephone Encounter (Signed)
Ok thanks , will talk to her at her visit tomorrow

## 2019-07-24 NOTE — Telephone Encounter (Signed)
lea call to remind patient of appt for tomorrow and patient stated that she and her therapist has tried several times to call our office thou when i look i have no record of any phone calls either from her or her tharpist.  Pt states that she weaned herself off the wellbutrin and that she will talk with you about that tomorrow.

## 2019-07-24 NOTE — Telephone Encounter (Signed)
i looked and it doesnt appear that your order the wellbutrin.  according to your note on  05-31-19 it states  For tobacco use disorder-unstable She is on Wellbutrin XL 150 mg p.o. daily Provided smoking cessation counseling.   So i'm wonder if her PCP is giving medication and she left message there.

## 2019-07-25 ENCOUNTER — Encounter: Payer: Self-pay | Admitting: Psychiatry

## 2019-07-25 ENCOUNTER — Other Ambulatory Visit: Payer: Self-pay

## 2019-07-25 ENCOUNTER — Ambulatory Visit (INDEPENDENT_AMBULATORY_CARE_PROVIDER_SITE_OTHER): Payer: 59 | Admitting: Psychiatry

## 2019-07-25 DIAGNOSIS — F633 Trichotillomania: Secondary | ICD-10-CM

## 2019-07-25 DIAGNOSIS — F3181 Bipolar II disorder: Secondary | ICD-10-CM

## 2019-07-25 DIAGNOSIS — F410 Panic disorder [episodic paroxysmal anxiety] without agoraphobia: Secondary | ICD-10-CM

## 2019-07-25 MED ORDER — LAMICTAL 200 MG PO TABS: 200.0000 mg | ORAL_TABLET | Freq: Every day | ORAL | 1 refills | Status: DC

## 2019-07-25 NOTE — Progress Notes (Signed)
Virtual Visit via Video Note  I connected with Stacy Mckinney on 07/25/19 at  2:00 PM EDT by a video enabled telemedicine application and verified that I am speaking with the correct person using two identifiers.   I discussed the limitations of evaluation and management by telemedicine and the availability of in person appointments. The patient expressed understanding and agreed to proceed.   I discussed the assessment and treatment plan with the patient. The patient was provided an opportunity to ask questions and all were answered. The patient agreed with the plan and demonstrated an understanding of the instructions.   The patient was advised to call back or seek an in-person evaluation if the symptoms worsen or if the condition fails to improve as anticipated.   [place] MD OP Progress Note  07/25/2019 6:01 PM Stacy Mckinney  MRN:  [id number]  Chief Complaint:  Chief Complaint    Follow-up     HPI: Stacy Mckinney is a 48 year old Caucasian female, employed, lives in [place], has a history of bipolar disorder, trichotillomania, hyperlipidemia was evaluated by telemedicine today.  Patient today reports she has been doing okay with regards to her mood symptoms.She does have some anger issues on and off .  She continues to take lamotrigine as prescribed.  She reports there are some days when she is more anxious however she wants to stay on this dosage at this time.  She has tried a higher dosage of lamotrigine in the past which gave her a rash.  Patient currently denies any panic attacks.  She reports she had to stop the Wellbutrin which was for her smoking cessation.  It was helpful with the smoking cessation however she had to stop it.  She reports that it gave her knee joint pain and swelling.  She continues to have that at this time in spite of stopping the Wellbutrin.  Discussed with patient to reach out to her primary medical doctor for an evaluation.  Patient does report a history of trichotillomania-she  continues to be in therapy with Ms. Laverle Hobby in [place].  Patient denies any suicidality, homicidality or perceptual disturbances. Visit Diagnosis:    ICD-10-CM   1. Bipolar 2 disorder (HCC)  F31.81 LAMICTAL 200 MG tablet   depressed, mild  2. Panic attacks  F41.0   3. 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  Past trials of Latuda-side effects, Depakote-did well, Wellbutrin, Valium, Lamictal brand, Lamictal generic-gave her side effects, Xanax  Past Medical History:  Past Medical History:  Diagnosis Date  . Abdominal pain   . Anxiety   . Bipolar disorder ([place])   . Bladder disorder   . FHx: cardiovascular disease   . H/O ovarian cystectomy   . Heart murmur   . Hyperlipidemia   . Nausea   . Smoker   . Tobacco use disorder     Past Surgical History:  Procedure Laterality Date  . ABDOMINAL HYSTERECTOMY  05/23/2016  . CYSTOSCOPY  05/23/2017   Procedure: CYSTOSCOPY;  Surgeon: Ward, Honor Loh, MD;  Location: [facility];  Service: Gynecology;;  . ELBOW SURGERY     left elbow  . LAPAROSCOPIC APPENDECTOMY  10/23/2012   Procedure: APPENDECTOMY LAPAROSCOPIC;  Surgeon: Earnstine Regal, MD;  Location: [facility];  Service: General;  Laterality: N/A;  . LAPAROSCOPIC HYSTERECTOMY N/A 05/23/2017   Procedure: HYSTERECTOMY TOTAL LAPAROSCOPIC;  Surgeon: Ward, Honor Loh, MD;  Location: [facility];  Service:  Gynecology;  Laterality: N/A;  . LAPAROSCOPIC OVARIAN CYSTECTOMY    . LAPAROSCOPIC UNILATERAL SALPINGECTOMY Left 05/23/2017   Procedure: LAPAROSCOPIC UNILATERAL SALPINGECTOMY;  Surgeon: Ward, Honor Loh, MD;  Location: [facility];  Service: Gynecology;  Laterality: Left;  . LAPAROSCOPIC UNILATERAL SALPINGO OOPHERECTOMY Right 05/23/2017   Procedure: LAPAROSCOPIC UNILATERAL SALPINGO OOPHORECTOMY;  Surgeon: Ward, Honor Loh, MD;  Location: [facility];  Service: Gynecology;  Laterality: Right;  . LEEP    . RHINOPLASTY   age 33   due to broken nose  . TONSILLECTOMY     age 44    Family Psychiatric History: I have reviewed family psychiatric history from my progress note on 05/31/2019.  Family History:  Family History  Problem Relation Age of Onset  . Heart disease Father   . Hypertension Father   . Stroke Maternal Grandmother   . Cancer Maternal Grandmother        ovarian  . Stroke Paternal Grandmother   . Depression Paternal Grandmother   . Cancer Paternal Grandmother        breast, ovarian  . Breast cancer Paternal Grandmother   . Cancer Maternal Aunt        ovarian  . Stroke Maternal Grandfather   . Stroke Paternal Grandfather     Social History: I have reviewed social history from my progress note on 05/31/2019. Social History   Socioeconomic History  . Marital status: Married    Spouse name: rob  . Number of children: 0  . Years of education: Not on file  . Highest education level: Associate degree: occupational, Hotel manager, or vocational program  Occupational History    Comment: full time  Social Needs  . Financial resource strain: Not hard at all  . Food insecurity    Worry: Never true    Inability: Never true  . Transportation needs    Medical: No    Non-medical: No  Tobacco Use  . Smoking status: Current Every Day Smoker    Packs/day: 1.50    Years: 25.00    Pack years: 37.50    Types: Cigarettes  . Smokeless tobacco: Never Used  Substance and Sexual Activity  . Alcohol use: Not Currently    Comment: 3-4 drinks a month  . Drug use: No  . Sexual activity: Yes  Lifestyle  . Physical activity    Days per week: 3 days    Minutes per session: 30 min  . Stress: Very much  Relationships  . Social Herbalist on phone: Not on file    Gets together: Not on file    Attends religious service: Never    Active member of club or organization: No    Attends meetings of clubs or organizations: Never    Relationship status: Married  Other Topics Concern  . Not on file   Social History Narrative  . Not on file    Allergies:  Allergies  Allergen Reactions  . Other Other (See Comments)    Patient cannot take any form of birth control in pill form. Makes the patient pass out.  . Nitrous Oxide Other (See Comments)    Passes out  . Sulfa Antibiotics Diarrhea and Nausea And Vomiting  . Wellbutrin [Bupropion]     Joint swelling  . Tessalon Perles [Benzonatate] Anxiety    caused mood swings in spite of being on Lamictal    Metabolic Disorder Labs: No results found for: HGBA1C, MPG No results found for: PROLACTIN Lab Results  Component Value  Date   CHOL 240 (H) 11/16/2016   TRIG 385 (H) 11/16/2016   HDL 29 (L) 11/16/2016   CHOLHDL 8.3 (H) 11/16/2016   VLDL 77 (H) 11/16/2016   LDLCALC 134 (H) 11/16/2016   LDLCALC 81 10/13/2012   No results found for: TSH  Therapeutic Level Labs: No results found for: LITHIUM No results found for: VALPROATE No components found for:  CBMZ  Current Medications: Current Outpatient Medications  Medication Sig Dispense Refill  . diazepam (VALIUM) 2 MG tablet Take by mouth.    . fluticasone (FLONASE) 50 MCG/ACT nasal spray Place 1 spray into both nostrils daily as needed for allergies or rhinitis.    Marland Kitchen gabapentin (NEURONTIN) 300 MG capsule Take 1,200 mg by mouth at bedtime.     Marland Kitchen LAMICTAL 200 MG tablet Take 1 tablet (200 mg total) by mouth daily. 90 tablet 1  . potassium chloride (KCL) 2 mEq/mL SOLN oral liquid Take by mouth.    . valACYclovir (VALTREX) 1000 MG tablet TAKE 2 TABLETS BY MOUTH TWICE A DAY FOR 1 DAY 20 tablet 1   No current facility-administered medications for this visit.      Musculoskeletal: Strength & Muscle Tone: UTA Gait & Station: Observed as seated Patient leans: N/A  Psychiatric Specialty Exam: Review of Systems  Psychiatric/Behavioral: The patient is nervous/anxious.   All other systems reviewed and are negative.   There were no vitals taken for this visit.There is no height or  weight on file to calculate BMI.  General Appearance: Casual  Eye Contact:  Fair  Speech:  Clear and Coherent  Volume:  Normal  Mood:  Anxious  Affect:  Congruent  Thought Process:  Goal Directed and Descriptions of Associations: Intact  Orientation:  Full (Time, Place, and Person)  Thought Content: Logical   Suicidal Thoughts:  No  Homicidal Thoughts:  No  Memory:  Immediate;   Fair Recent;   Fair Remote;   Fair  Judgement:  Fair  Insight:  Fair  Psychomotor Activity:  Normal  Concentration:  Concentration: Fair and Attention Span: Fair  Recall:  [employer] of Knowledge: Fair  Language: Fair  Akathisia:  No  Handed:  Right  AIMS (if indicated): denies tremors, rigidity  Assets:  Communication Skills Desire for Improvement Housing  ADL's:  Intact  Cognition: WNL  Sleep:  Fair   Screenings: PHQ2-9     Office Visit from 11/16/2016 in [facility]  PHQ-2 Total Score  0       Assessment and Plan: Kinlei is a 48 year old Caucasian female, employed, lives in [place], has a history of bipolar disorder, trichotillomania, panic attacks, tobacco use disorder, hyperlipidemia was evaluated by telemedicine today.  She is biologically predisposed given her history of trauma.  She also has psychosocial stressors of relationship struggles.  Patient continues to struggle with some irritability and anxiety however has been coping okay.  She wants to stay on the current medication regimen at this time.  She will continue psychotherapy sessions.  Plan Bipolar disorder type II- some progress Lamotrigine 200 mg p.o. daily-she uses only brand name. Continue psychotherapy sessions with Ms. Laverle Hobby in [place]  For trichotillomania-chronic Continue psychotherapy sessions.  For tobacco use disorder-unstable Discontinue Wellbutrin for side effects. She will continue to cut back.  For panic attacks-stable Valium 2 mg p.o. as needed only for severe anxiety attacks.  She takes  Valium every 3 to 4 months as needed.  Follow-up in clinic in 2 months or sooner if  needed.  October 6 at 2:30 PM  I have spent atleast 15 minutes non  face to face with patient today. More than 50 % of the time was spent for psychoeducation and supportive psychotherapy and care coordination. This note was generated in part or whole with voice recognition software. Voice recognition is usually quite accurate but there are transcription errors that can and very often do occur. I apologize for any typographical errors that were not detected and corrected.       Ursula Alert, MD 07/25/2019, 6:01 PM

## 2019-07-27 ENCOUNTER — Ambulatory Visit
Admission: RE | Admit: 2019-07-27 | Discharge: 2019-07-27 | Disposition: A | Discharge status: Home / Self Care | Payer: 59 | Source: Ambulatory Visit | Attending: Family Medicine | Admitting: Family Medicine

## 2019-07-27 DIAGNOSIS — N6002 Solitary cyst of left breast: Secondary | ICD-10-CM | POA: Diagnosis not present

## 2019-07-31 ENCOUNTER — Other Ambulatory Visit: Payer: Self-pay | Admitting: Family Medicine

## 2019-07-31 DIAGNOSIS — Z1231 Encounter for screening mammogram for malignant neoplasm of breast: Secondary | ICD-10-CM

## 2019-07-31 DIAGNOSIS — N6320 Unspecified lump in the left breast, unspecified quadrant: Secondary | ICD-10-CM

## 2019-07-31 DIAGNOSIS — R928 Other abnormal and inconclusive findings on diagnostic imaging of breast: Secondary | ICD-10-CM

## 2019-09-04 ENCOUNTER — Ambulatory Visit (INDEPENDENT_AMBULATORY_CARE_PROVIDER_SITE_OTHER): Payer: 59 | Admitting: Psychiatry

## 2019-09-04 ENCOUNTER — Other Ambulatory Visit: Payer: Self-pay

## 2019-09-04 ENCOUNTER — Encounter: Payer: Self-pay | Admitting: Psychiatry

## 2019-09-04 DIAGNOSIS — F633 Trichotillomania: Secondary | ICD-10-CM | POA: Diagnosis not present

## 2019-09-04 DIAGNOSIS — F3181 Bipolar II disorder: Secondary | ICD-10-CM | POA: Diagnosis not present

## 2019-09-04 DIAGNOSIS — F410 Panic disorder [episodic paroxysmal anxiety] without agoraphobia: Secondary | ICD-10-CM | POA: Diagnosis not present

## 2019-09-04 DIAGNOSIS — F17200 Nicotine dependence, unspecified, uncomplicated: Secondary | ICD-10-CM

## 2019-09-04 NOTE — Progress Notes (Signed)
Virtual Visit via Video Note  I connected with Stacy Mckinney on 09/04/19 at  2:30 PM EDT by a video enabled telemedicine application and verified that I am speaking with the correct person using two identifiers.   I discussed the limitations of evaluation and management by telemedicine and the availability of in person appointments. The patient expressed understanding and agreed to proceed.   I discussed the assessment and treatment plan with the patient. The patient was provided an opportunity to ask questions and all were answered. The patient agreed with the plan and demonstrated an understanding of the instructions.   The patient was advised to call back or seek an in-person evaluation if the symptoms worsen or if the condition fails to improve as anticipated.  [place] MD OP Progress Note  09/04/2019 5:47 PM [name]  MRN:  [id number]  Chief Complaint:  Chief Complaint    Follow-up     [id number] is a 48 year old Caucasian female, employed, lives in [place], has a history of bipolar disorder, trichotillomania, hyperlipidemia, tobacco use disorder was evaluated by telemedicine today.  Patient today reports she is currently doing well with regards to her mood.  She denies any significant mood lability.  The lamotrigine is helpful.  She currently denies any anxiety attacks or panic symptoms.  She reports sleep is good.  She reports that she continues to smoke cigarettes.  She has been making use of nicotine lozenges to help with smoking cessation.  Patient denies any suicidality, homicidality or perceptual disturbances.  She continues to work with her therapist and reports therapy sessions is going well.  She reports she is trying to lose weight.  She is trying to exercise.     Visit Diagnosis:    ICD-10-CM   1. Bipolar 2 disorder (HCC)  F31.81   2. 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 Tobacco use disorder  F17.200     Past Psychiatric History: I have reviewed  past psychiatric history from my progress note on 05/31/2019.  Past trials of Latuda-side effects, Depakote-did well, Wellbutrin, Valium, Lamictal brand, Lamictal generic-gave her side effects, Xanax, Chantix-side effects  Past Medical History:  Past Medical History:  Diagnosis Date  . Abdominal pain   . Anxiety   . Bipolar disorder ([place])   . Bladder disorder   . FHx: cardiovascular disease   . H/O ovarian cystectomy   . Heart murmur   . Hyperlipidemia   . Nausea   . Smoker   . Tobacco use disorder     Past Surgical History:  Procedure Laterality Date  . ABDOMINAL HYSTERECTOMY  05/23/2016  . CYSTOSCOPY  05/23/2017   Procedure: CYSTOSCOPY;  Surgeon: Ward, Honor Loh, MD;  Location: [facility];  Service: Gynecology;;  . ELBOW SURGERY     left elbow  . LAPAROSCOPIC APPENDECTOMY  10/23/2012   Procedure: APPENDECTOMY LAPAROSCOPIC;  Surgeon: Earnstine Regal, MD;  Location: [facility];  Service: General;  Laterality: N/A;  . LAPAROSCOPIC HYSTERECTOMY N/A 05/23/2017   Procedure: HYSTERECTOMY TOTAL LAPAROSCOPIC;  Surgeon: Ward, Honor Loh, MD;  Location: [facility];  Service: Gynecology;  Laterality: N/A;  . LAPAROSCOPIC OVARIAN CYSTECTOMY    . LAPAROSCOPIC UNILATERAL SALPINGECTOMY Left 05/23/2017   Procedure: LAPAROSCOPIC UNILATERAL SALPINGECTOMY;  Surgeon: Ward, Honor Loh, MD;  Location: [facility];  Service: Gynecology;  Laterality: Left;  . LAPAROSCOPIC UNILATERAL SALPINGO OOPHERECTOMY Right 05/23/2017   Procedure: LAPAROSCOPIC UNILATERAL SALPINGO OOPHORECTOMY;  Surgeon: Ward, Honor Loh, MD;  Location: [facility];  Service: Gynecology;  Laterality: Right;  . LEEP    . RHINOPLASTY  age 10   due to broken nose  . TONSILLECTOMY     age 14    Family Psychiatric History: I have reviewed family psychiatric history from my progress note on 05/31/2019 Family History:  Family History  Problem Relation Age of Onset  . Heart disease Father   . Hypertension Father   . Stroke Maternal Grandmother   . Cancer  Maternal Grandmother        ovarian  . Stroke Paternal Grandmother   . Depression Paternal Grandmother   . Cancer Paternal Grandmother        breast, ovarian  . Breast cancer Paternal Grandmother   . Cancer Maternal Aunt        ovarian  . Stroke Maternal Grandfather   . Stroke Paternal Grandfather     Social History: Reviewed social history from my progress note on 05/31/2019 Social History   Socioeconomic History  . Marital status: Married    Spouse name: rob  . Number of children: 0  . Years of education: Not on file  . Highest education level: Associate degree: occupational, Hotel manager, or vocational program  Occupational History    Comment: full time  Social Needs  . Financial resource strain: Not hard at all  . Food insecurity    Worry: Never true    Inability: Never true  . Transportation needs    Medical: No    Non-medical: No  Tobacco Use  . Smoking status: Current Every Day Smoker    Packs/day: 1.50    Years: 25.00    Pack years: 37.50    Types: Cigarettes  . Smokeless tobacco: Never Used  Substance and Sexual Activity  . Alcohol use: Not Currently    Comment: 3-4 drinks a month  . Drug use: No  . Sexual activity: Yes  Lifestyle  . Physical activity    Days per week: 3 days    Minutes per session: 30 min  . Stress: Very much  Relationships  . Social Herbalist on phone: Not on file    Gets together: Not on file    Attends religious service: Never    Active member of club or organization: No    Attends meetings of clubs or organizations: Never    Relationship status: Married  Other Topics Concern  . Not on file  Social History Narrative  . Not on file    Allergies:  Allergies  Allergen Reactions  . Other Other (See Comments)    Patient cannot take any form of birth control in pill form. Makes the patient pass out.  . Atorvastatin     Other reaction(s): Muscle Pain  . Nitrous Oxide Other (See Comments)    Passes out  . Sulfa  Antibiotics Diarrhea and Nausea And Vomiting  . Wellbutrin [Bupropion]     Joint swelling  . Tessalon Perles [Benzonatate] Anxiety    caused mood swings in spite of being on Lamictal    Metabolic Disorder Labs: No results found for: HGBA1C, MPG No results found for: PROLACTIN Lab Results  Component Value Date   CHOL 240 (H) 11/16/2016   TRIG 385 (H) 11/16/2016   HDL 29 (L) 11/16/2016   CHOLHDL 8.3 (H) 11/16/2016   VLDL 77 (H) 11/16/2016   LDLCALC 134 (H) 11/16/2016   LDLCALC 81 10/13/2012   No results found for: TSH  Therapeutic Level Labs: No results found for: LITHIUM  No results found for: VALPROATE No components found for:  CBMZ  Current Medications: Current Outpatient Medications  Medication Sig Dispense Refill  . diazepam (VALIUM) 2 MG tablet Take by mouth.    . fluticasone (FLONASE) 50 MCG/ACT nasal spray Place 1 spray into both nostrils daily as needed for allergies or rhinitis.    Marland Kitchen gabapentin (NEURONTIN) 300 MG capsule Take 1,200 mg by mouth at bedtime.     Marland Kitchen LAMICTAL 200 MG tablet Take 1 tablet (200 mg total) by mouth daily. 90 tablet 1  . Magnesium 250 MG TABS Take by mouth.    . potassium chloride (KCL) 2 mEq/mL SOLN oral liquid Take by mouth.    . valACYclovir (VALTREX) 1000 MG tablet TAKE 2 TABLETS BY MOUTH TWICE A DAY FOR 1 DAY 20 tablet 1   No current facility-administered medications for this visit.      Musculoskeletal: Strength & Muscle Tone: UTA Gait & Station: Observed as seated Patient leans: N/A  Psychiatric Specialty Exam: Review of Systems  Psychiatric/Behavioral: Negative for hallucinations and suicidal ideas. The patient is not nervous/anxious.   All other systems reviewed and are negative.   There were no vitals taken for this visit.There is no height or weight on file to calculate BMI.  General Appearance: Casual  Eye Contact:  Fair  Speech:  Clear and Coherent  Volume:  Normal  Mood:  Euthymic  Affect:  Congruent  Thought  Process:  Goal Directed and Descriptions of Associations: Intact  Orientation:  Full (Time, Place, and Person)  Thought Content: Logical   Suicidal Thoughts:  No  Homicidal Thoughts:  No  Memory:  Immediate;   Fair Recent;   Fair Remote;   Fair  Judgement:  Fair  Insight:  Fair  Psychomotor Activity:  Normal  Concentration:  Concentration: Fair and Attention Span: Fair  Recall:  [employer] of Knowledge: Fair  Language: Fair  Akathisia:  No  Handed:  Right  AIMS (if indicated): denies tremors, rigidity  Assets:  Communication Skills Desire for Improvement Social Support  ADL's:  Intact  Cognition: WNL  Sleep:  Fair   Screenings: PHQ2-9     Office Visit from 11/16/2016 in [facility]  PHQ-2 Total Score  0       Assessment and Plan: Amiyla is a 48 year old Caucasian female, employed, lives in [place], has a history of bipolar disorder, trichotillomania, panic attacks, tobacco use disorder, hyperlipidemia was evaluated by telemedicine today.  Patient is biologically predisposed given her history of trauma.  She also has psychosocial stressors of relationship struggles.  Patient is currently making progress.  Plan as noted below.  Plan For bipolar disorder type II-improving Lamotrigine 200 mg p.o. daily.  That she uses only brand name. Continue psychotherapy sessions with Ms. Laverle Hobby in [place].  Trichotillomania-chronic Continue psychotherapy sessions.  Tobacco use disorder-improving She will make use of nicotine lozenges.  Provided smoking cessation counseling.  Panic attacks-stable Valium 2 mg p.o. daily as needed for anxiety attacks-he takes it 3-4 times a month only.  Follow-up in clinic in 3 months or sooner if needed.  December 23 at 2:30 PM  I have spent atleast 15 minutes non face to face with patient today. More than 50 % of the time was spent for psychoeducation and supportive psychotherapy and care coordination. This note was generated in part  or whole with voice recognition software. Voice recognition is usually quite accurate but there are transcription errors that can and very often do  occur. I apologize for any typographical errors that were not detected and corrected.       Ursula Alert, MD 09/04/2019, 5:47 PM

## 2019-09-21 DIAGNOSIS — B359 Dermatophytosis, unspecified: Secondary | ICD-10-CM | POA: Insufficient documentation

## 2019-10-23 ENCOUNTER — Telehealth: Payer: Self-pay

## 2019-10-23 NOTE — Telephone Encounter (Signed)
Left message for patient that she already has Lamictal refill available . Advised to call us back if she needs anything else.

## 2019-10-23 NOTE — Telephone Encounter (Signed)
pt called left a message that she would like a refill on her medications.

## 2019-10-24 ENCOUNTER — Telehealth: Payer: Self-pay

## 2019-10-24 DIAGNOSIS — F3181 Bipolar II disorder: Secondary | ICD-10-CM

## 2019-10-24 NOTE — Telephone Encounter (Signed)
Medication management - Patient left two messages that she would like to speak to Dr. Shea Evans about her medication orders.  Pt. stated she preferred to speak with Dr. Shea Evans directly on messages.

## 2019-10-24 NOTE — Telephone Encounter (Signed)
Returned call to patient again.  Left voicemail for patient to call us back.  Also left message that the clinic is closed due to Thanksgiving until Monday.  Writer had also attempted to contact patient yesterday after she had left a message.  No response.

## 2019-10-29 MED ORDER — LAMICTAL 200 MG PO TABS: 200.0000 mg | ORAL_TABLET | Freq: Every day | ORAL | 1 refills | Status: AC

## 2019-10-29 NOTE — Telephone Encounter (Signed)
Received message that patient needs a new treatment for Lamictal-[facility]  Sent new prescription to pharmacy for name brand only.

## 2019-10-29 NOTE — Telephone Encounter (Signed)
pt states she needs a new rx of her lamictal name [facility]. Pt states she needs a prior auth also. Pt was told that the pharmacy would need to send over the information before a prior auth could be done.

## 2019-11-06 ENCOUNTER — Telehealth: Payer: Self-pay

## 2019-11-06 NOTE — Telephone Encounter (Signed)
[phone]  ref # [id number]  insurance will not cover the lamictal they will need something else to replace the rx.

## 2019-11-07 NOTE — Telephone Encounter (Signed)
Attempted to call patient.  Left message on her voicemail to call back.

## 2019-11-20 ENCOUNTER — Encounter: Payer: Self-pay | Admitting: Psychiatry

## 2019-11-20 ENCOUNTER — Ambulatory Visit (INDEPENDENT_AMBULATORY_CARE_PROVIDER_SITE_OTHER): Payer: 59 | Admitting: Psychiatry

## 2019-11-20 ENCOUNTER — Telehealth: Payer: Self-pay

## 2019-11-20 ENCOUNTER — Other Ambulatory Visit: Payer: Self-pay

## 2019-11-20 DIAGNOSIS — F3181 Bipolar II disorder: Secondary | ICD-10-CM | POA: Diagnosis not present

## 2019-11-20 DIAGNOSIS — F633 Trichotillomania: Secondary | ICD-10-CM | POA: Diagnosis not present

## 2019-11-20 DIAGNOSIS — F410 Panic disorder [episodic paroxysmal anxiety] without agoraphobia: Secondary | ICD-10-CM | POA: Diagnosis not present

## 2019-11-20 NOTE — Telephone Encounter (Signed)
ok 

## 2019-11-20 NOTE — Telephone Encounter (Signed)
i [facility] and they states that it was shipped on the 11th and that est arrival the 24th.  i tried to call pt to let her know and it states that you can not complete your call at this time please try again later.

## 2019-11-20 NOTE — Progress Notes (Signed)
Virtual Visit via Video Note  I connected with Stacy Mckinney on 11/20/19 at  2:30 PM EST by a video enabled telemedicine application and verified that I am speaking with the correct person using two identifiers.   I discussed the limitations of evaluation and management by telemedicine and the availability of in person appointments. The patient expressed understanding and agreed to proceed.     I discussed the assessment and treatment plan with the patient. The patient was provided an opportunity to ask questions and all were answered. The patient agreed with the plan and demonstrated an understanding of the instructions.   The patient was advised to call back or seek an in-person evaluation if the symptoms worsen or if the condition fails to improve as anticipated.    [place] MD OP Progress Note  11/20/2019 5:56 PM Stacy Mckinney  MRN:  [id number]  Chief Complaint:  Chief Complaint    Follow-up     HPI: [name] is a 48 year old Caucasian female, employed, lives in [place], has a history of bipolar disorder, trichotillomania, hyperlipidemia, tobacco use disorder was evaluated by telemedicine today.  Patient today appeared to be anxious.  Patient during the session reported that she is currently going through several psychosocial stressors.  She reports her dog is currently struggling with health issues.  She reports while she was away from home her parents took care of her dog and the dog had an accident and probably ingested something that was toxic .  Patient reports she loves her dog and this is very stressful for her.  She is trying to get the right help for her dog at this time.  Patient also reports she had an incident when she lost her book that contained all her passwords at an airport.  Patient reports that was also stressful for her since she had to go through a lot of phone calls and steps to take care of her accounts and other things.  She has had a challenging week.  She reports she had a  meltdown however was able to cope.  She reports today is a better day for her.  Patient reports however that she does not want to make any medication dosage readjustments at this time.  She wants to stay wherever she is with her medications for now.  She reports she does not like " change'.  Patient reports she has reached out to her therapist and will continue to work with her therapist.  Patient during the session also discussed that she has been having trouble receiving phone calls back from [place].  Writer reviewed patient's chart and writer had returned patient's calls several times however it always went into a voicemail.  Discussed with patient that writer had left voicemail messages for patient.   Visit Diagnosis:    ICD-10-CM   1. Bipolar 2 disorder (HCC)  F31.81    DEPRESSED, MILD   2. Panic attacks  F41.0   3. 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  Past trials of Latuda-side effects, Depakote-did well, Wellbutrin, Valium, Lamictal brand, Lamictal generic-gave her side effects, Xanax, Chantix-side effects.  Past Medical History:  Past Medical History:  Diagnosis Date  . Abdominal pain   . Anxiety   . Bipolar disorder ([place])   . Bladder disorder   . FHx: cardiovascular disease   . H/O ovarian cystectomy   . Heart murmur   . Hyperlipidemia   .  Nausea   . Smoker   . Tobacco use disorder     Past Surgical History:  Procedure Laterality Date  . ABDOMINAL HYSTERECTOMY  05/23/2016  . CYSTOSCOPY  05/23/2017   Procedure: CYSTOSCOPY;  Surgeon: Ward, Honor Loh, MD;  Location: [facility] ORS;  Service: Gynecology;;  . ELBOW SURGERY     left elbow  . LAPAROSCOPIC APPENDECTOMY  10/23/2012   Procedure: APPENDECTOMY LAPAROSCOPIC;  Surgeon: Earnstine Regal, MD;  Location: [facility] ORS;  Service: General;  Laterality: N/A;  . LAPAROSCOPIC HYSTERECTOMY N/A 05/23/2017   Procedure: HYSTERECTOMY TOTAL LAPAROSCOPIC;   Surgeon: Ward, Honor Loh, MD;  Location: [facility] ORS;  Service: Gynecology;  Laterality: N/A;  . LAPAROSCOPIC OVARIAN CYSTECTOMY    . LAPAROSCOPIC UNILATERAL SALPINGECTOMY Left 05/23/2017   Procedure: LAPAROSCOPIC UNILATERAL SALPINGECTOMY;  Surgeon: Ward, Honor Loh, MD;  Location: [facility] ORS;  Service: Gynecology;  Laterality: Left;  . LAPAROSCOPIC UNILATERAL SALPINGO OOPHERECTOMY Right 05/23/2017   Procedure: LAPAROSCOPIC UNILATERAL SALPINGO OOPHORECTOMY;  Surgeon: Ward, Honor Loh, MD;  Location: [facility] ORS;  Service: Gynecology;  Laterality: Right;  . LEEP    . RHINOPLASTY  age 32   due to broken nose  . TONSILLECTOMY     age 34    Family Psychiatric History: I have reviewed family psychiatric history from my progress note on 05/31/2019.  Family History:  Family History  Problem Relation Age of Onset  . Heart disease Father   . Hypertension Father   . Stroke Maternal Grandmother   . Cancer Maternal Grandmother        ovarian  . Stroke Paternal Grandmother   . Depression Paternal Grandmother   . Cancer Paternal Grandmother        breast, ovarian  . Breast cancer Paternal Grandmother   . Cancer Maternal Aunt        ovarian  . Stroke Maternal Grandfather   . Stroke Paternal Grandfather     Social History: I have reviewed social history from my progress note on 05/31/2019. Social History   Socioeconomic History  . Marital status: Married    Spouse name: rob  . Number of children: 0  . Years of education: Not on file  . Highest education level: Associate degree: occupational, Hotel manager, or vocational program  Occupational History    Comment: full time  Tobacco Use  . Smoking status: Current Every Day Smoker    Packs/day: 1.50    Years: 25.00    Pack years: 37.50    Types: Cigarettes  . Smokeless tobacco: Never Used  Substance and Sexual Activity  . Alcohol use: Not Currently    Comment: 3-4 drinks a month  . Drug use: No  . Sexual activity: Yes  Other Topics Concern  . Not on  file  Social History Narrative  . Not on file   Social Determinants of Health   Financial Resource Strain: Low Risk   . Difficulty of Paying Living Expenses: Not hard at all  Food Insecurity: No Food Insecurity  . Worried About Charity fundraiser in the Last Year: Never true  . Ran Out of Food in the Last Year: Never true  Transportation Needs: No Transportation Needs  . Lack of Transportation (Medical): No  . Lack of Transportation (Non-Medical): No  Physical Activity: Insufficiently Active  . Days of Exercise per Week: 3 days  . Minutes of Exercise per Session: 30 min  Stress: Stress Concern Present  . Feeling of Stress : Very much  Social Connections: Unknown  .  Frequency of Communication with Friends and Family: Not on file  . Frequency of Social Gatherings with Friends and Family: Not on file  . Attends Religious Services: Never  . Active Member of Clubs or Organizations: No  . Attends Archivist Meetings: Never  . Marital Status: Married    Allergies:  Allergies  Allergen Reactions  . Other Other (See Comments)    Patient cannot take any form of birth control in pill form. Makes the patient pass out.  . Atorvastatin     Other reaction(s): Muscle Pain  . Nitrous Oxide Other (See Comments)    Passes out  . Sulfa Antibiotics Diarrhea and Nausea And Vomiting  . Wellbutrin [Bupropion]     Joint swelling  . Tessalon Perles [Benzonatate] Anxiety    caused mood swings in spite of being on Lamictal    Metabolic Disorder Labs: No results found for: HGBA1C, MPG No results found for: PROLACTIN Lab Results  Component Value Date   CHOL 240 (H) 11/16/2016   TRIG 385 (H) 11/16/2016   HDL 29 (L) 11/16/2016   CHOLHDL 8.3 (H) 11/16/2016   VLDL 77 (H) 11/16/2016   LDLCALC 134 (H) 11/16/2016   LDLCALC 81 10/13/2012   No results found for: TSH  Therapeutic Level Labs: No results found for: LITHIUM No results found for: VALPROATE No components found for:   CBMZ  Current Medications: Current Outpatient Medications  Medication Sig Dispense Refill  . diazepam (VALIUM) 2 MG tablet Take by mouth.    . fluticasone (FLONASE) 50 MCG/ACT nasal spray Place 1 spray into both nostrils daily as needed for allergies or rhinitis.    Marland Kitchen gabapentin (NEURONTIN) 300 MG capsule Take 1,200 mg by mouth at bedtime.     Marland Kitchen LAMICTAL 200 MG tablet Take 1 tablet (200 mg total) by mouth daily. 90 tablet 1  . Magnesium 250 MG TABS Take by mouth.    . valACYclovir (VALTREX) 1000 MG tablet TAKE 2 TABLETS BY MOUTH TWICE A DAY FOR 1 DAY 20 tablet 1  . potassium chloride (KCL) 2 mEq/mL SOLN oral liquid Take by mouth.     No current facility-administered medications for this visit.     Musculoskeletal: Strength & Muscle Tone: UTA Gait & Station: normal Patient leans: N/A  Psychiatric Specialty Exam: Review of Systems  Psychiatric/Behavioral: The patient is nervous/anxious.   All other systems reviewed and are negative.   There were no vitals taken for this visit.There is no height or weight on file to calculate BMI.  General Appearance: Casual  Eye Contact:  Fair  Speech:  Clear and Coherent  Volume:  Normal  Mood:  Angry and Anxious  Affect:  Congruent  Thought Process:  Goal Directed and Descriptions of Associations: Intact  Orientation:  Full (Time, Place, and Person)  Thought Content: Logical   Suicidal Thoughts:  No  Homicidal Thoughts:  No  Memory:  Immediate;   Fair Recent;   Fair Remote;   Fair  Judgement:  Fair  Insight:  Fair  Psychomotor Activity:  Normal  Concentration:  Concentration: Fair and Attention Span: Fair  Recall:  [employer] of Knowledge: Fair  Language: Fair  Akathisia:  No  Handed:  Right  AIMS (if indicated): UTA  Assets:  Communication Skills Desire for Improvement Housing Social Support  ADL's:  Intact  Cognition: WNL  Sleep:  Fair   Screenings: PHQ2-9     Office Visit from 11/16/2016 in [facility]   PHQ-2 Total  Score  0       Assessment and Plan: Davan is a 48 year old Caucasian female, employed, lives in [place] , has a history of bipolar disorder, trichotillomania, panic attacks, tobacco use disorder, hyperlipidemia was evaluated by telemedicine today.  Patient is biologically predisposed given her history of trauma.  She also has psychosocial stressors of relationship struggles, recent situational stressors as summarized above.  Patient however reports she is not interested in medication changes and wants to continue to work with her therapist.  Plan as noted below.  Plan Bipolar disorder type II-improving Lamotrigine 200 mg p.o. daily.  Patient uses brand name. Patient to continue psychotherapy sessions with Ms. Laverle Hobby in [place].  Trichotillomania-chronic Continue psychotherapy sessions  Tobacco use disorder-improving Will monitor closely.  Panic attacks-stable Valium 2 mg p.o. daily as needed for anxiety attacks-3-4 times a month only.  Follow-up in clinic in 2 to 3 months or sooner if needed.  Patient however today reports she does not want to schedule a follow-up session at this time.    I have spent atleast 15 minutes non face to face with patient today. More than 50 % of the time was spent for psychoeducation and supportive psychotherapy and care coordination. This note was generated in part or whole with voice recognition software. Voice recognition is usually quite accurate but there are transcription errors that can and very often do occur. I apologize for any typographical errors that were not detected and corrected.       Ursula Alert, MD 11/20/2019, 5:56 PM

## 2019-11-21 ENCOUNTER — Ambulatory Visit: Payer: 59 | Admitting: Psychiatry

## 2020-02-22 ENCOUNTER — Ambulatory Visit
Admission: RE | Admit: 2020-02-22 | Discharge: 2020-02-22 | Disposition: A | Discharge status: Home / Self Care | Payer: 59 | Source: Ambulatory Visit | Attending: Family Medicine | Admitting: Family Medicine

## 2020-02-22 DIAGNOSIS — N6320 Unspecified lump in the left breast, unspecified quadrant: Secondary | ICD-10-CM | POA: Insufficient documentation

## 2020-02-22 DIAGNOSIS — Z1231 Encounter for screening mammogram for malignant neoplasm of breast: Secondary | ICD-10-CM

## 2020-02-22 DIAGNOSIS — R928 Other abnormal and inconclusive findings on diagnostic imaging of breast: Secondary | ICD-10-CM

## 2020-02-24 ENCOUNTER — Ambulatory Visit: Payer: 59 | Attending: Internal Medicine

## 2020-02-24 DIAGNOSIS — Z23 Encounter for immunization: Secondary | ICD-10-CM

## 2020-02-24 NOTE — Progress Notes (Signed)
   Covid-19 Vaccination Clinic  Name:  Stacy Mckinney    MRN: [id number] DOB: [birth year]  02/24/2020  Stacy Mckinney was observed post Covid-19 immunization for 30 minutes based on pre-vaccination screening without incident. She was provided with Vaccine Information Sheet and instruction to access the V-Safe system.   Stacy Mckinney was instructed to call 911 with any severe reactions post vaccine: Marland Kitchen Difficulty breathing  . Swelling of face and throat  . A fast heartbeat  . A bad rash all over body  . Dizziness and weakness   Immunizations Administered    Name Date Dose VIS Date Route   Pfizer COVID-19 Vaccine 02/24/2020 12:50 PM 0.3 mL 11/09/2019 Intramuscular   Manufacturer: [place]   Lot: [id number]   [place]: [id number]

## 2020-03-18 ENCOUNTER — Ambulatory Visit: Payer: 59 | Attending: Internal Medicine

## 2020-03-18 DIAGNOSIS — Z23 Encounter for immunization: Secondary | ICD-10-CM

## 2020-03-18 NOTE — Progress Notes (Signed)
   Covid-19 Vaccination Clinic  Name:  Stacy Mckinney    MRN: [id number] DOB: 1971-05-28  03/18/2020  [name] was observed post Covid-19 immunization for 15 minutes without incident. She was provided with Vaccine Information Sheet and instruction to access the V-Safe system.   [name] was instructed to call 911 with any severe reactions post vaccine: Marland Kitchen Difficulty breathing  . Swelling of face and throat  . A fast heartbeat  . A bad rash all over body  . Dizziness and weakness   Immunizations Administered    Name Date Dose VIS Date Route   Pfizer COVID-19 Vaccine 03/18/2020  1:33 PM 0.3 mL 01/23/2019 Intramuscular   Manufacturer: [place]   Lot: [id number]   [place]: [id number]

## 2020-04-24 ENCOUNTER — Emergency Department
Admission: EM | Admit: 2020-04-24 | Discharge: 2020-04-24 | Disposition: A | Discharge status: Home / Self Care | Payer: 59 | Attending: Emergency Medicine | Admitting: Emergency Medicine

## 2020-04-24 ENCOUNTER — Emergency Department: Payer: 59

## 2020-04-24 ENCOUNTER — Other Ambulatory Visit: Payer: Self-pay

## 2020-04-24 ENCOUNTER — Encounter: Payer: Self-pay | Admitting: Emergency Medicine

## 2020-04-24 DIAGNOSIS — F17210 Nicotine dependence, cigarettes, uncomplicated: Secondary | ICD-10-CM | POA: Insufficient documentation

## 2020-04-24 DIAGNOSIS — R0789 Other chest pain: Secondary | ICD-10-CM | POA: Diagnosis not present

## 2020-04-24 DIAGNOSIS — R0602 Shortness of breath: Secondary | ICD-10-CM | POA: Diagnosis not present

## 2020-04-24 LAB — BASIC METABOLIC PANEL
Anion gap: 8 (ref 5–15)
BUN: 17 mg/dL (ref 6–20)
CO2: 26 mmol/L (ref 22–32)
Calcium: 9.7 mg/dL (ref 8.9–10.3)
Chloride: 106 mmol/L (ref 98–111)
Creatinine, Ser: 0.79 mg/dL (ref 0.44–1.00)
GFR calc Af Amer: >60 mL/min (ref >60)
GFR calc non Af Amer: >60 mL/min (ref >60)
Glucose, Bld: 108 mg/dL — ABNORMAL HIGH (ref 70–99)
Potassium: 4.5 mmol/L (ref 3.5–5.1)
Sodium: 140 mmol/L (ref 135–145)

## 2020-04-24 LAB — CBC
HCT: 42.8 % (ref 36.0–46.0)
Hemoglobin: 14 g/dL (ref 12.0–15.0)
MCH: 27.5 pg (ref 26.0–34.0)
MCHC: 32.7 g/dL (ref 30.0–36.0)
MCV: 84.1 fL (ref 80.0–100.0)
Platelets: 242 10*3/uL (ref 150–400)
RBC: 5.09 MIL/uL (ref 3.87–5.11)
RDW: 13.7 % (ref 11.5–15.5)
WBC: 9.3 10*3/uL (ref 4.0–10.5)
nRBC: 0 % (ref 0.0–0.2)

## 2020-04-24 LAB — TROPONIN I (HIGH SENSITIVITY)
Troponin I (High Sensitivity): 3 ng/L (ref <18)
Troponin I (High Sensitivity): 4 ng/L (ref <18)

## 2020-04-24 MED ORDER — PREDNISONE 20 MG PO TABS: 40.0000 mg | ORAL_TABLET | Freq: Every day | ORAL | 0 refills | Status: AC

## 2020-04-24 MED ORDER — VALACYCLOVIR HCL 1 G PO TABS: 1000.0000 mg | ORAL_TABLET | Freq: Three times a day (TID) | ORAL | 0 refills | Status: AC

## 2020-04-24 NOTE — Discharge Instructions (Addendum)
As we discussed, I would recommend starting the prednisone as well as a higher dose valacyclovir, which I prescribed.  You can use over-the-counter or topical lidocaine to help with pain as well as your Voltaren gel.  If the pain does not improve or persist, call your doctor and I would consider an ultrasound as an outpatient of your gallbladder.

## 2020-04-24 NOTE — ED Provider Notes (Signed)
[facility] Emergency Department Provider Note  ____________________________________________   None    (approximate)  I have reviewed the triage vital signs and the nursing notes.   HISTORY  Chief Complaint Chest Pain    HPI Stacy Mckinney is a 49 y.o. female with past medical history as below here with right upper chest pain.  The patient states that her symptoms have been present for approximately the past week.  She states that  it started as a moderate, aching, throbbing, right sided chest pain just under her right breast that has persisted since onset.  She has had some mild shortness of breath when the pain is severe, but no persistent shortness of breath.  No cough or sputum production.  No hemoptysis.  She has a history of chronic, recurrent chest pain in similar distribution due to recurrent shingles and zoster related neuropathy, for which she takes gabapentin.  However, the pain has been fairly more persistent and she has not noticed any new lesion so she presents for evaluation.  Denies history of DVT/PE.  The pain does not seem to be related to eating or drinking and she has had no nausea, vomiting, fever, or chills.       Past Medical History:  Diagnosis Date  . Abdominal pain   . Anxiety   . Bipolar disorder ([place])   . Bladder disorder   . FHx: cardiovascular disease   . H/O ovarian cystectomy   . Heart murmur   . Hyperlipidemia   . Nausea   . Smoker   . Tobacco use disorder     Patient Active Problem List   Diagnosis Date Noted  . Ringworm 09/21/2019  . Pelvic pain in female 05/31/2019  . Bipolar 2 disorder ([place]) 05/31/2019  . 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 Panic attacks 05/31/2019  . History of abnormal cervical Pap smear 04/01/2014  . Tobacco use disorder 01/25/2014  . Herpes labialis 08/04/2012  . Mood disorder ([place]) 08/04/2012  . Hyperlipidemia with target low density lipoprotein (LDL) cholesterol less than 100 mg/dL 10/07/2011   . Family history of heart disease in female family member before age 45 10/07/2011    Past Surgical History:  Procedure Laterality Date  . ABDOMINAL HYSTERECTOMY  05/23/2016  . CYSTOSCOPY  05/23/2017   Procedure: CYSTOSCOPY;  Surgeon: Ward, Honor Loh, MD;  Location: [facility] ORS;  Service: Gynecology;;  . ELBOW SURGERY     left elbow  . LAPAROSCOPIC APPENDECTOMY  10/23/2012   Procedure: APPENDECTOMY LAPAROSCOPIC;  Surgeon: Earnstine Regal, MD;  Location: [facility];  Service: General;  Laterality: N/A;  . LAPAROSCOPIC HYSTERECTOMY N/A 05/23/2017   Procedure: HYSTERECTOMY TOTAL LAPAROSCOPIC;  Surgeon: Ward, Honor Loh, MD;  Location: [facility] ORS;  Service: Gynecology;  Laterality: N/A;  . LAPAROSCOPIC OVARIAN CYSTECTOMY    . LAPAROSCOPIC UNILATERAL SALPINGECTOMY Left 05/23/2017   Procedure: LAPAROSCOPIC UNILATERAL SALPINGECTOMY;  Surgeon: Ward, Honor Loh, MD;  Location: [facility] ORS;  Service: Gynecology;  Laterality: Left;  . LAPAROSCOPIC UNILATERAL SALPINGO OOPHERECTOMY Right 05/23/2017   Procedure: LAPAROSCOPIC UNILATERAL SALPINGO OOPHORECTOMY;  Surgeon: Ward, Honor Loh, MD;  Location: [facility] ORS;  Service: Gynecology;  Laterality: Right;  . LEEP    . RHINOPLASTY  age 64   due to broken nose  . TONSILLECTOMY     age 65    Prior to Admission medications   Medication Sig Start Date End Date Taking? Authorizing Provider  diazepam (VALIUM) 2 MG tablet Take by mouth. 12/21/17   [provider]  fluticasone (FLONASE) 50 MCG/ACT nasal spray Place 1 spray into both nostrils daily as needed for allergies or rhinitis.    [provider]  gabapentin (NEURONTIN) 300 MG capsule Take 1,200 mg by mouth at bedtime.     [provider]  LAMICTAL 200 MG tablet Take 1 tablet (200 mg total) by mouth daily. 10/29/19   Ursula Alert, MD  Magnesium 250 MG TABS Take by mouth.    [provider]  potassium chloride (KCL) 2 mEq/mL SOLN oral liquid Take by mouth.    [provider]    predniSONE (DELTASONE) 20 MG tablet Take 2 tablets (40 mg total) by mouth daily for 7 days. 04/24/20 05/01/20  Duffy Bruce, MD  valACYclovir (VALTREX) 1000 MG tablet Take 1 tablet (1,000 mg total) by mouth 3 (three) times daily for 7 days. 04/24/20 05/01/20  Duffy Bruce, MD    Allergies Other, Atorvastatin, Nitrous oxide, Sulfa antibiotics, Wellbutrin [bupropion], and Tessalon perles [benzonatate]  Family History  Problem Relation Age of Onset  . Heart disease Father   . Hypertension Father   . Stroke Maternal Grandmother   . Cancer Maternal Grandmother        ovarian  . Stroke Paternal Grandmother   . Depression Paternal Grandmother   . Cancer Paternal Grandmother        breast, ovarian  . Breast cancer Paternal Grandmother   . Cancer Maternal Aunt        ovarian  . Stroke Maternal Grandfather   . Stroke Paternal Grandfather     Social History Social History   Tobacco Use  . Smoking status: Current Every Day Smoker    Packs/day: 1.50    Years: 25.00    Pack years: 37.50    Types: Cigarettes  . Smokeless tobacco: Never Used  Substance Use Topics  . Alcohol use: Not Currently    Comment: 3-4 drinks a month  . Drug use: No    Review of Systems  Review of Systems  Constitutional: Positive for fatigue. Negative for fever.  HENT: Negative for congestion and sore throat.   Eyes: Negative for visual disturbance.  Respiratory: Positive for chest tightness. Negative for cough and shortness of breath.   Cardiovascular: Positive for chest pain.  Gastrointestinal: Negative for abdominal pain, diarrhea, nausea and vomiting.  Genitourinary: Negative for flank pain.  Musculoskeletal: Negative for back pain and neck pain.  Skin: Negative for rash and wound.  Neurological: Negative for weakness.     ____________________________________________  PHYSICAL EXAM:      VITAL SIGNS: ED Triage Vitals  Enc Vitals Group     BP 04/24/20 1035 (!) 133/93     Pulse Rate 04/24/20  1035 86     Resp 04/24/20 1035 18     Temp 04/24/20 1035 98.4 F (36.9 C)     Temp Source 04/24/20 1035 Oral     SpO2 04/24/20 1035 100 %     Weight 04/24/20 1032 231 lb (104.8 kg)     Height 04/24/20 1032 5\' 4"  (1.626 m)     Head Circumference --      Peak Flow --      Pain Score 04/24/20 1032 7     Pain Loc --      Pain Edu? --      Excl. in [place]? --      Physical Exam Vitals and nursing note reviewed.  Constitutional:      General: She is not in acute distress.  Appearance: She is well-developed.  HENT:     Head: Normocephalic and atraumatic.  Eyes:     Conjunctiva/sclera: Conjunctivae normal.  Cardiovascular:     Rate and Rhythm: Normal rate and regular rhythm.     Heart sounds: Normal heart sounds. No murmur. No friction rub.  Pulmonary:     Effort: Pulmonary effort is normal. No respiratory distress.     Breath sounds: Normal breath sounds. No wheezing or rales.  Abdominal:     General: There is no distension.     Palpations: Abdomen is soft.     Tenderness: There is no abdominal tenderness.  Musculoskeletal:     Cervical back: Neck supple.  Skin:    General: Skin is warm.     Capillary Refill: Capillary refill takes less than 2 seconds.  Neurological:     Mental Status: She is alert and oriented to person, place, and time.     Motor: No abnormal muscle tone.       ____________________________________________   LABS (all labs ordered are listed, but only abnormal results are displayed)  Labs Reviewed  BASIC METABOLIC PANEL - Abnormal; Notable for the following components:      Result Value   Glucose, Bld 108 (*)    All other components within normal limits  CBC  TROPONIN I (HIGH SENSITIVITY)  TROPONIN I (HIGH SENSITIVITY)    ____________________________________________  EKG: Normal sinus rhythm, ventricular rate 90.  PR 120, QRS 100, QTc 435.  Incomplete right bundle branch block.  No acute ST elevations or  depressions. ________________________________________  RADIOLOGY All imaging, including plain films, CT scans, and ultrasounds, independently reviewed by me, and interpretations confirmed via formal radiology reads.  ED MD interpretation:   Chest x-ray: Clear  Official radiology report(s): DG Chest 2 View  Result Date: 04/24/2020 CLINICAL DATA:  Chest pain EXAM: CHEST - 2 VIEW COMPARISON:  12/06/2017 FINDINGS: The heart size appears normal. No pleural effusion or edema identified. Calcified granuloma is again identified within the right upper lobe. No airspace consolidation. IMPRESSION: No acute cardiopulmonary abnormalities. Electronically Signed   By: Kerby Moors M.D.   On: 04/24/2020 10:56    ____________________________________________  PROCEDURES   Procedure(s) performed (including Critical Care):  Procedures  ____________________________________________  INITIAL IMPRESSION / MDM / [place] / ED COURSE  As part of my medical decision making, I reviewed the following data within the [place] notes reviewed and incorporated, Old chart reviewed, Notes from prior ED visits, and [place] Controlled Substance Database       *Stacy Mckinney was evaluated in Emergency Department on 04/24/2020 for the symptoms described in the history of present illness. She was evaluated in the context of the global COVID-19 pandemic, which necessitated consideration that the patient might be at risk for infection with the SARS-CoV-2 virus that causes COVID-19. Institutional protocols and algorithms that pertain to the evaluation of patients at risk for COVID-19 are in a state of rapid change based on information released by regulatory bodies including the CDC and federal and state organizations. These policies and algorithms were followed during the patient's care in the ED.  Some ED evaluations and interventions may be delayed as a result of limited staffing during the  pandemic.*     Medical Decision Making: 49 year old well-appearing female here with mild right upper chest pain.  Suspect this could be related to her chronic shingles related neuropathy and she feels strongly this is similar distribution and location of this.  EKG is nonischemic and troponins are negative x2 with low risk heart score and I do not suspect ischemia or ACS.  She is not tachycardic, tachypneic, hypoxic, and I do not suspect she has PE.  She has no cough, sputum production, and chest x-ray is clear without evidence of pneumonia or other acute abnormality.  Her right upper quadrant is completely soft and nontender on examination, and she has no nausea, vomiting, or symptoms to suggest biliary colic or referred pain from the pancreas or liver.  Her lab work is very reassuring.  I did very long discussion with the patient regarding her symptoms, and will plan to treat symptomatically with prednisone and Valtrex as it is very similar to her prior episodes, along with very good return precautions.  Patient in agreement with this plan.  ____________________________________________  FINAL CLINICAL IMPRESSION(S) / ED DIAGNOSES  Final diagnoses:  Atypical chest pain     MEDICATIONS GIVEN DURING THIS VISIT:  Medications - No data to display   ED Discharge Orders         Ordered    predniSONE (DELTASONE) 20 MG tablet  Daily     04/24/20 1836    valACYclovir (VALTREX) 1000 MG tablet  3 times daily     04/24/20 1836           Note:  This document was prepared using Dragon voice recognition software and may include unintentional dictation errors.   Duffy Bruce, MD 04/24/20 [phone]

## 2020-04-24 NOTE — ED Triage Notes (Signed)
Patient to ER for c/o chest pain. 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 Patient reports hypertension ([id number] systolic) and tachycardia at home (HR 120's), reports feeling anxious. Patient reports chest pain as tightness/heaviness with +dizziness.

## 2020-05-02 ENCOUNTER — Other Ambulatory Visit (HOSPITAL_COMMUNITY): Payer: Self-pay | Admitting: Gerontology

## 2020-05-02 ENCOUNTER — Other Ambulatory Visit: Payer: Self-pay | Admitting: Gerontology

## 2020-05-02 DIAGNOSIS — R1013 Epigastric pain: Secondary | ICD-10-CM

## 2020-05-02 DIAGNOSIS — R1011 Right upper quadrant pain: Secondary | ICD-10-CM

## 2020-05-07 ENCOUNTER — Ambulatory Visit: Payer: 59

## 2020-05-08 ENCOUNTER — Other Ambulatory Visit: Payer: Self-pay | Admitting: Psychiatry

## 2020-05-08 DIAGNOSIS — F3181 Bipolar II disorder: Secondary | ICD-10-CM

## 2020-08-14 ENCOUNTER — Other Ambulatory Visit: Payer: 59

## 2020-08-19 ENCOUNTER — Ambulatory Visit
Admission: EM | Admit: 2020-08-19 | Discharge: 2020-08-19 | Disposition: A | Discharge status: Home / Self Care | Payer: 59 | Attending: Physician Assistant | Admitting: Physician Assistant

## 2020-08-19 ENCOUNTER — Other Ambulatory Visit: Payer: Self-pay

## 2020-08-19 DIAGNOSIS — R21 Rash and other nonspecific skin eruption: Secondary | ICD-10-CM

## 2020-08-19 MED ORDER — PREDNISONE 10 MG PO TABS: ORAL_TABLET | ORAL | 0 refills | Status: DC

## 2020-08-19 NOTE — ED Triage Notes (Signed)
Patient complains of an itchy rash that came up on Saturday on her abdomen and chest.

## 2020-08-19 NOTE — ED Provider Notes (Signed)
[facility]    CSN: [id number] Arrival date & time: 08/19/20  1607      History   Chief Complaint Chief Complaint  Patient presents with  . Rash    HPI Stacy Mckinney is a 49 y.o. female.   Patient presents for a rash that is itchy on the abdomen and chest.  She says the rash started 3 days ago.  She says she worked outside a few hours before the rash started.  She has tried over-the-counter hydrocortisone cream and Benadryl ointment without relief.  She admits to similar rashes in the past after working outside.  She denies any rash affecting his face.  Denies any facial swelling, throat tightness, chest tightness, breathing difficulty.  Denies any new medications or topical ointments or creams.  No other concerns today.  HPI  Past Medical History:  Diagnosis Date  . Abdominal pain   . Anxiety   . Bipolar disorder ([place])   . Bladder disorder   . FHx: cardiovascular disease   . H/O ovarian cystectomy   . Heart murmur   . Hyperlipidemia   . Nausea   . Smoker   . Tobacco use disorder     Patient Active Problem List   Diagnosis Date Noted  . Ringworm 09/21/2019  . Pelvic pain in female 05/31/2019  . Bipolar 2 disorder ([place]) 05/31/2019  . 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 Panic attacks 05/31/2019  . History of abnormal cervical Pap smear 04/01/2014  . Tobacco use disorder 01/25/2014  . Herpes labialis 08/04/2012  . Mood disorder ([place]) 08/04/2012  . Hyperlipidemia with target low density lipoprotein (LDL) cholesterol less than 100 mg/dL 10/07/2011  . Family history of heart disease in female family member before age 50 10/07/2011    Past Surgical History:  Procedure Laterality Date  . ABDOMINAL HYSTERECTOMY  05/23/2016  . CYSTOSCOPY  05/23/2017   Procedure: CYSTOSCOPY;  Surgeon: Ward, Honor Loh, MD;  Location: [facility] ORS;  Service: Gynecology;;  . ELBOW SURGERY     left elbow  . LAPAROSCOPIC APPENDECTOMY  10/23/2012   Procedure: APPENDECTOMY LAPAROSCOPIC;   Surgeon: Earnstine Regal, MD;  Location: [facility];  Service: General;  Laterality: N/A;  . LAPAROSCOPIC HYSTERECTOMY N/A 05/23/2017   Procedure: HYSTERECTOMY TOTAL LAPAROSCOPIC;  Surgeon: Ward, Honor Loh, MD;  Location: [facility] OR[facility];  Service: Gynecology;  Laterality: N/A;  . LAPAROSCOPIC OVARIAN CYSTECTOMY    . LAPAROSCOPIC UNILATERAL SALPINGECTOMY Left 05/23/2017   Procedure: LAPAROSCOPIC UNILATERAL SALPINGECTOMY;  Surgeon: Ward, Honor Loh, MD;  Location: [facility];  Service: Gynecology;  Laterality: Left;  . LAPAROSCOPIC UNILATERAL SALPINGO OOPHERECTOMY Right 05/23/2017   Procedure: LAPAROSCOPIC UNILATERAL SALPINGO OOPHORECTOMY;  Surgeon: Ward, Honor Loh, MD;  Location: [facility] ORS;  Service: Gynecology;  Laterality: Right;  . LEEP    . RHINOPLASTY  age 24   due to broken nose  . TONSILLECTOMY     age 48    OB History   No obstetric history on file.      Home Medications    Prior to Admission medications   Medication Sig Start Date End Date Taking? Authorizing Provider  diazepam (VALIUM) 2 MG tablet Take by mouth. 12/21/17  Yes [provider]  gabapentin (NEURONTIN) 300 MG capsule Take 1,200 mg by mouth at bedtime.    Yes [provider]  LAMICTAL 200 MG tablet Take 1 tablet (200 mg total) by mouth daily. 10/29/19  Yes Ursula Alert, MD  Magnesium 250 MG TABS Take by mouth.   Yes [provider]  fluticasone (FLONASE) 50 MCG/ACT nasal spray Place 1 spray into both nostrils daily as needed for allergies or rhinitis.    [provider]  potassium chloride (KCL) 2 mEq/mL SOLN oral liquid Take by mouth.    [provider]  predniSONE (DELTASONE) 10 MG tablet Take 6 tabs by mouth on the first day and decrease by 1 tablet for the next 5 days. 08/19/20   Danton Clap, PA-C    Family History Family History  Problem Relation Age of Onset  . Heart disease Father   . Hypertension Father   . Stroke Maternal Grandmother   . Cancer Maternal Grandmother         ovarian  . Stroke Paternal Grandmother   . Depression Paternal Grandmother   . Cancer Paternal Grandmother        breast, ovarian  . Breast cancer Paternal Grandmother   . Cancer Maternal Aunt        ovarian  . Stroke Maternal Grandfather   . Stroke Paternal Grandfather     Social History Social History   Tobacco Use  . Smoking status: Current Every Day Smoker    Packs/day: 1.50    Years: 25.00    Pack years: 37.50    Types: Cigarettes  . Smokeless tobacco: Never Used  Vaping Use  . Vaping Use: Never used  Substance Use Topics  . Alcohol use: Not Currently    Comment: 3-4 drinks a month  . Drug use: No     Allergies   Other, Atorvastatin, Nitrous oxide, Sulfa antibiotics, Wellbutrin [bupropion], and Tessalon perles [benzonatate]   Review of Systems Review of Systems  Constitutional: Negative for chills, diaphoresis, fatigue and fever.  HENT: Negative for congestion, ear pain, rhinorrhea, sinus pressure, sinus pain and sore throat.   Respiratory: Negative for cough and shortness of breath.   Gastrointestinal: Negative for abdominal pain, nausea and vomiting.  Musculoskeletal: Negative for arthralgias and myalgias.  Skin: Positive for rash.  Neurological: Negative for weakness and headaches.  Hematological: Negative for adenopathy.     Physical Exam Triage Vital Signs ED Triage Vitals  Enc Vitals Group     BP 08/19/20 1647 (!) 157/103     Pulse Rate 08/19/20 1647 84     Resp 08/19/20 1647 17     Temp 08/19/20 1647 98.5 F (36.9 C)     Temp Source 08/19/20 1647 Oral     SpO2 08/19/20 1647 99 %     Weight 08/19/20 1646 200 lb (90.7 kg)     Height 08/19/20 1646 5\' 4"  (1.626 m)     Head Circumference --      Peak Flow --      Pain Score 08/19/20 1645 6     Pain Loc --      Pain Edu? --      Excl. in [place]? --    No data found.  Updated Vital Signs BP (!) 157/103 (BP Location: Right Arm)   Pulse 84   Temp 98.5 F (36.9 C) (Oral)   Resp 17   Ht 5'  4" (1.626 m)   Wt 200 lb (90.7 kg)   LMP  (LMP Unknown)   SpO2 99%   BMI 34.33 kg/m       Physical Exam Vitals and nursing note reviewed.  Constitutional:      General: She is not in acute distress.    Appearance: Normal appearance. She is not ill-appearing or toxic-appearing.  HENT:  Head: Normocephalic and atraumatic.     Nose: Nose normal.     Mouth/Throat:     Mouth: Mucous membranes are moist.     Pharynx: Oropharynx is clear.  Eyes:     General: No scleral icterus.       Right eye: No discharge.        Left eye: No discharge.     Conjunctiva/sclera: Conjunctivae normal.  Cardiovascular:     Rate and Rhythm: Normal rate and regular rhythm.     Heart sounds: Normal heart sounds.  Pulmonary:     Effort: Pulmonary effort is normal. No respiratory distress.     Breath sounds: Normal breath sounds.  Musculoskeletal:     Cervical back: Neck supple.  Skin:    General: Skin is dry.     Findings: Rash (Multiple scattered patches of erythematous papules and vesicles affecting the chest and bilateral upper extremities.  Few excoriations noted.) present.  Neurological:     General: No focal deficit present.     Mental Status: She is alert. Mental status is at baseline.     Motor: No weakness.     Gait: Gait normal.  Psychiatric:        Mood and Affect: Mood normal.        Behavior: Behavior normal.        Thought Content: Thought content normal.      UC Treatments / Results  Labs (all labs ordered are listed, but only abnormal results are displayed) Labs Reviewed - No data to display  EKG   Radiology No results found.  Procedures Procedures (including critical care time)  Medications Ordered in UC Medications - No data to display  Initial Impression / Assessment and Plan / UC Course  I have reviewed the triage vital signs and the nursing notes.  Pertinent labs & imaging results that were available during my care of the patient were reviewed by me and  considered in my medical decision making (see chart for details).   Advised patient her rash is consistent with allergic or irritant dermatitis.  Advised her to take non drowsy Claritin during the day and Benadryl at night.  Advised her to start the prednisone Dosepak.  Advised her to try to scratch the rash.  She should follow-up for any spreading rash or if it is not getting better with this treatment.  ED precautions discussed.  Final Clinical Impressions(s) / UC Diagnoses   Final diagnoses:  Rash and nonspecific skin eruption     Discharge Instructions     Try Claritin nondrowsy during the day.  Start prednisone.  Keep the rash moisturized.  Try not to scratch.  Call or follow back up if the rash is not getting better over the next few days.    ED Prescriptions    Medication Sig Dispense Auth. Provider   predniSONE (DELTASONE) 10 MG tablet Take 6 tabs by mouth on the first day and decrease by 1 tablet for the next 5 days. 21 tablet Danton Clap, PA-C     PDMP not reviewed this encounter.   Danton Clap, PA-C 08/20/20 1406

## 2020-08-19 NOTE — Discharge Instructions (Signed)
Try Claritin nondrowsy during the day.  Start prednisone.  Keep the rash moisturized.  Try not to scratch.  Call or follow back up if the rash is not getting better over the next few days.

## 2020-08-22 ENCOUNTER — Other Ambulatory Visit: Payer: 59

## 2020-08-26 ENCOUNTER — Ambulatory Visit: Admission: RE | Admit: 2020-08-26 | Payer: 59 | Source: Home / Self Care

## 2020-08-26 ENCOUNTER — Encounter: Admission: RE | Payer: Self-pay | Source: Home / Self Care

## 2020-08-26 SURGERY — ESOPHAGOGASTRODUODENOSCOPY (EGD) WITH PROPOFOL
Anesthesia: General

## 2020-10-28 ENCOUNTER — Other Ambulatory Visit: Payer: Self-pay | Admitting: Family Medicine

## 2020-10-28 ENCOUNTER — Other Ambulatory Visit (HOSPITAL_COMMUNITY): Payer: Self-pay | Admitting: Family Medicine

## 2020-10-28 DIAGNOSIS — R079 Chest pain, unspecified: Secondary | ICD-10-CM

## 2020-11-04 ENCOUNTER — Ambulatory Visit
Admission: RE | Admit: 2020-11-04 | Discharge: 2020-11-04 | Disposition: A | Discharge status: Home / Self Care | Payer: 59 | Source: Ambulatory Visit | Attending: Family Medicine | Admitting: Family Medicine

## 2020-11-04 ENCOUNTER — Other Ambulatory Visit: Payer: Self-pay

## 2020-11-04 DIAGNOSIS — R079 Chest pain, unspecified: Secondary | ICD-10-CM | POA: Insufficient documentation

## 2021-11-26 ENCOUNTER — Encounter: Payer: Self-pay | Admitting: Emergency Medicine

## 2021-11-26 ENCOUNTER — Ambulatory Visit: Admit: 2021-11-26 | Payer: 59

## 2021-11-26 ENCOUNTER — Ambulatory Visit
Admission: EM | Admit: 2021-11-26 | Discharge: 2021-11-26 | Disposition: A | Discharge status: Home / Self Care | Payer: 59 | Attending: Physician Assistant | Admitting: Physician Assistant

## 2021-11-26 ENCOUNTER — Other Ambulatory Visit: Payer: Self-pay

## 2021-11-26 DIAGNOSIS — S61210A Laceration without foreign body of right index finger without damage to nail, initial encounter: Secondary | ICD-10-CM | POA: Diagnosis not present

## 2021-11-26 NOTE — ED Provider Notes (Addendum)
[facility]    CSN: [id number] Arrival date & time: 11/26/21  0859      History   Chief Complaint Chief Complaint  Patient presents with   Laceration    HPI Stacy Mckinney is a 50 y.o. female presenting for right index finger laceration that she sustained last night after she actually cut on a pair of scissors.  Reports today it reopened.  Patient reports that she is going out of town to [place] in a couple of days and would like to have the area glued if possible.  She denies any significant pain, numbness or tingling.  Forage motion of her finger.  Last tetanus was a month ago.  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   Panic attacks 05/31/2019   History of abnormal cervical Pap smear 04/01/2014   Tobacco use disorder 01/25/2014   Herpes labialis 08/04/2012   Mood disorder ([place]) 08/04/2012   Hyperlipidemia with target low density lipoprotein (LDL) cholesterol less than 100 mg/dL 10/07/2011   Family history of heart disease in female family member before age 70 10/07/2011    Past Surgical History:  Procedure Laterality Date   ABDOMINAL HYSTERECTOMY  05/23/2016   CYSTOSCOPY  05/23/2017   Procedure: CYSTOSCOPY;  Surgeon: Ward, Honor Loh, MD;  Location: [facility];  Service: Gynecology;;   ELBOW SURGERY     left elbow   LAPAROSCOPIC APPENDECTOMY  10/23/2012   Procedure: APPENDECTOMY LAPAROSCOPIC;  Surgeon: Earnstine Regal, MD;  Location: [facility];  Service: General;  Laterality: N/A;   LAPAROSCOPIC HYSTERECTOMY N/A 05/23/2017   Procedure: HYSTERECTOMY TOTAL LAPAROSCOPIC;   Surgeon: Ward, Honor Loh, MD;  Location: [facility];  Service: Gynecology;  Laterality: N/A;   LAPAROSCOPIC OVARIAN CYSTECTOMY     LAPAROSCOPIC UNILATERAL SALPINGECTOMY Left 05/23/2017   Procedure: LAPAROSCOPIC UNILATERAL SALPINGECTOMY;  Surgeon: Ward, Honor Loh, MD;  Location: [facility];  Service: Gynecology;  Laterality: Left;   LAPAROSCOPIC UNILATERAL SALPINGO OOPHERECTOMY Right 05/23/2017   Procedure: LAPAROSCOPIC UNILATERAL SALPINGO OOPHORECTOMY;  Surgeon: Ward, Honor Loh, MD;  Location: [facility];  Service: Gynecology;  Laterality: Right;   LEEP     RHINOPLASTY  age 39   due to broken nose   TONSILLECTOMY     age 35    OB History   No obstetric history on file.      Home Medications    Prior to Admission medications   Medication Sig Start Date End Date Taking? Authorizing Provider  diazepam (VALIUM) 2 MG tablet Take by mouth. 12/21/17   [provider]  fluticasone (FLONASE) 50 MCG/ACT nasal spray Place 1 spray into both nostrils daily as needed for allergies or rhinitis.    [provider]  gabapentin (NEURONTIN) 300 MG capsule Take 1,200 mg by mouth at bedtime.     [provider]  LAMICTAL 200 MG tablet Take 1 tablet (200 mg total) by mouth daily. 10/29/19   Ursula Alert, MD  Magnesium 250 MG TABS Take by  mouth.    [provider]  potassium chloride (KCL) 2 mEq/mL SOLN oral liquid Take by mouth.    [provider]  predniSONE (DELTASONE) 10 MG tablet Take 6 tabs by mouth on the first day and decrease by 1 tablet for the next 5 days. Patient not taking: Reported on 11/26/2021 08/19/20   Gretta Cool    Family History Family History  Problem Relation Age of Onset   Heart disease Father    Hypertension Father    Stroke Maternal Grandmother    Cancer Maternal Grandmother        ovarian   Stroke Paternal Grandmother    Depression Paternal Grandmother    Cancer Paternal Grandmother        breast, ovarian   Breast cancer  Paternal Grandmother    Cancer Maternal Aunt        ovarian   Stroke Maternal Grandfather    Stroke Paternal Grandfather     Social History Social History   Tobacco Use   Smoking status: Every Day    Packs/day: 1.50    Years: 25.00    Pack years: 37.50    Types: Cigarettes   Smokeless tobacco: Never  Vaping Use   Vaping Use: Never used  Substance Use Topics   Alcohol use: Not Currently    Comment: 3-4 drinks a month   Drug use: No     Allergies   Other, Atorvastatin, Nitrous oxide, Sulfa antibiotics, Wellbutrin [bupropion], and Tessalon perles [benzonatate]   Review of Systems Review of Systems  Musculoskeletal:  Negative for arthralgias and joint swelling.  Skin:  Positive for wound. Negative for color change.  Neurological:  Negative for weakness and numbness.  Hematological:  Does not bruise/bleed easily.    Physical Exam Triage Vital Signs ED Triage Vitals [11/26/21 0951]  Enc Vitals Group     BP      Pulse      Resp      Temp      Temp src      SpO2      Weight      Height      Head Circumference      Peak Flow      Pain Score 4     Pain Loc      Pain Edu?      Excl. in [place]?    No data found.  Updated Vital Signs BP 124/89 (BP Location: Right Arm)    Pulse 77    Temp 98.2 F (36.8 C) (Oral)    Resp (!) 118    LMP  (LMP Unknown)    SpO2 100%      Physical Exam Vitals and nursing note reviewed.  Constitutional:      General: She is not in acute distress.    Appearance: Normal appearance. She is not ill-appearing or toxic-appearing.  HENT:     Head: Normocephalic and atraumatic.  Eyes:     General: No scleral icterus.       Right eye: No discharge.        Left eye: No discharge.     Conjunctiva/sclera: Conjunctivae normal.  Cardiovascular:     Rate and Rhythm: Normal rate.     Pulses: Normal pulses.  Pulmonary:     Effort: Pulmonary effort is normal. No respiratory distress.  Musculoskeletal:     Cervical back: Neck supple.  Skin:     General: Skin is dry.     Comments:  1 cm superficial J shaped laceration distal finger pad of right index finger. No bleeding. Mild TTP. Good strength and sensation of finger, full ROM  Neurological:     General: No focal deficit present.     Mental Status: She is alert. Mental status is at baseline.     Motor: No weakness.     Gait: Gait normal.  Psychiatric:        Mood and Affect: Mood normal.        Behavior: Behavior normal.        Thought Content: Thought content normal.     UC Treatments / Results  Labs (all labs ordered are listed, but only abnormal results are displayed) Labs Reviewed - No data to display  EKG   Radiology No results found.  Procedures Laceration Repair  Date/Time: 11/26/2021 10:20 AM Performed by: Danton Clap, PA-C Authorized by: Danton Clap, PA-C   Consent:    Consent obtained:  Verbal   Consent given by:  Patient   Risks, benefits, and alternatives were discussed: yes     Risks discussed:  Infection and pain   Alternatives discussed:  No treatment and delayed treatment Universal protocol:    Patient identity confirmed:  Verbally with patient Anesthesia:    Anesthesia method:  None Laceration details:    Location:  Finger   Finger location:  R index finger   Length (cm):  1   Depth (mm):  2 Exploration:    Contaminated: no   Treatment:    Area cleansed with:  Chlorhexidine   Amount of cleaning:  Standard   Irrigation solution:  Tap water   Irrigation method:  Pressure wash   Debridement:  None   Undermining:  None Skin repair:    Repair method:  Tissue adhesive Approximation:    Approximation:  Close Repair type:    Repair type:  Simple Post-procedure details:    Dressing:  Adhesive bandage   Procedure completion:  Tolerated well, no immediate complications (including critical care time)  Medications Ordered in UC Medications - No data to display  Initial Impression / Assessment and Plan / UC Course  I have  reviewed the triage vital signs and the nursing notes.  Pertinent labs & imaging results that were available during my care of the patient were reviewed by me and considered in my medical decision making (see chart for details).  50 year old female presenting for superficial laceration of right index finger.  Sustained laceration last night.  Tetanus up-to-date.  Patient is a very superficial laceration of her right index finger pad.  No bleeding.  Patient would like the area to be repaired with Dermabond.  No need for sutures as the laceration is very superficial.  Cleaned the wound with chlorhexidine and lukewarm water.  Repaired with Dermabond.  Patient tolerated well.  Discussed how to care for Dermabond.  Advised returning or seeking other medical care for any signs of infection.  Follow-up here as needed.   Final Clinical Impressions(s) / UC Diagnoses   Final diagnoses:  Laceration of right index finger without foreign body without damage to nail, initial encounter     Discharge Instructions      -See handout on how to care for the tissue adhesive.  20 get it wet or it can come off. - You need to be seen again for any signs of infection which include increased swelling, redness, pain or drainage from the site. - Change overlying bandage daily.  I expect  this laceration really should heal in the next 3 to 4 days.     ED Prescriptions   None    PDMP not reviewed this encounter.   Danton Clap, PA-C 11/26/21 1042    Laurene Footman B, PA-C 11/26/21 1043

## 2021-11-26 NOTE — Discharge Instructions (Addendum)
-  See handout on how to care for the tissue adhesive.  20 get it wet or it can come off. - You need to be seen again for any signs of infection which include increased swelling, redness, pain or drainage from the site. - Change overlying bandage daily.  I expect this laceration really should heal in the next 3 to 4 days.

## 2021-11-26 NOTE — ED Triage Notes (Addendum)
Injured right index finger last night.  Cut right index finger  with scissors .  Then this morning was grapping a package and reopened wound again.  Last tetanus was a month ago.  Patient leaving Saturday for [place].  J-shaped wound to right index finger on fingerprint pad.  No bleeding.  Patient washed wound with warm water and applied neosporin

## 2023-04-28 ENCOUNTER — Ambulatory Visit
Admission: EM | Admit: 2023-04-28 | Discharge: 2023-04-28 | Disposition: A | Discharge status: Home / Self Care | Payer: No Typology Code available for payment source | Attending: Emergency Medicine | Admitting: Emergency Medicine

## 2023-04-28 DIAGNOSIS — B9689 Other specified bacterial agents as the cause of diseases classified elsewhere: Secondary | ICD-10-CM | POA: Diagnosis present

## 2023-04-28 DIAGNOSIS — N760 Acute vaginitis: Secondary | ICD-10-CM | POA: Diagnosis present

## 2023-04-28 LAB — URINALYSIS, W/ REFLEX TO CULTURE (INFECTION SUSPECTED)
Bilirubin Urine: NEGATIVE
Glucose, UA: NEGATIVE mg/dL
Hgb urine dipstick: NEGATIVE
Ketones, ur: 15 mg/dL — AB
Leukocytes,Ua: NEGATIVE
Nitrite: NEGATIVE
Protein, ur: NEGATIVE mg/dL
Specific Gravity, Urine: 1.01 (ref 1.005–1.030)
pH: 6.5 (ref 5.0–8.0)

## 2023-04-28 LAB — WET PREP, GENITAL
Sperm: NONE SEEN
Trich, Wet Prep: NONE SEEN
WBC, Wet Prep HPF POC: <10 — AB (ref <10)
Yeast Wet Prep HPF POC: NONE SEEN

## 2023-04-28 MED ORDER — METRONIDAZOLE 500 MG PO TABS: 500.0000 mg | ORAL_TABLET | Freq: Two times a day (BID) | ORAL | 0 refills | Status: DC

## 2023-04-28 MED ORDER — FLUCONAZOLE 150 MG PO TABS: 150.0000 mg | ORAL_TABLET | Freq: Every day | ORAL | 0 refills | Status: AC

## 2023-04-28 NOTE — Discharge Instructions (Addendum)
Today you are being treated  for  Bacterial vaginosis which was confirmed on your vaginal swab most likely related to recent antibiotic use  Take Metronidazole 500 mg twice a day for 7 days, do not drink alcohol while using medication, this will make you feel sick   You have been sent in Diflucan prophylactically if you begin to have any signs of a yeast infection such as vaginal itching or irritation you may take 1 tablet then after completion of the antibiotic you may take the second dose  Bacterial vaginosis which results from an overgrowth of one on several organisms that are normally present in your vagina. Vaginosis is an inflammation of the vagina that can result in discharge, itching and pain.  In addition: Avoid baths, hot tubs and whirlpool spas.  Don't use scented or harsh soaps Avoid irritants. These include scented tampons and pads. Wipe from front to back after using the toilet. Don't douche. Your vagina doesn't require cleansing other than normal bathing.  Use a condom.  Wear cotton underwear, this fabric absorbs some moisture.

## 2023-04-28 NOTE — ED Triage Notes (Signed)
Pt c/o RUQ abd pain x1 mon & back pain x2 days. States she had a lingering UTI since 1st wk of may has tried macrobid w/o relief. Last wk she was given bactrim 2x daily for 7 days, still has 2 days worth. Has been drinking fluids frequently. Denies any N/V/D or burning/pain when voiding.

## 2023-04-28 NOTE — ED Provider Notes (Signed)
[facility]    CSN: [id number] Arrival date & time: 04/28/23  1732      History   Chief Complaint Chief Complaint  Patient presents with   Abdominal Pain   Back Pain    HPI Isavel Ferrelli is a 52 y.o. female.   Patient presents for evaluation of abdominal pain and back pain present for 2 days.  Wrapping around the upper aspect of the abdomen into the center, worse to the right side.  Currently taking Bactrim for suspected UTI, 2 days left of medication, earlier this month was treated for suspected UTI with Macrobid however symptoms persisted.  Initially experiencing urinary frequency, incomplete bladder emptying and dysuria which has resolved.  Denies vaginal symptoms, fevers, urinary symptoms.     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   Panic attacks 05/31/2019   History of abnormal cervical Pap smear 04/01/2014   Tobacco use disorder 01/25/2014   Herpes labialis 08/04/2012   Mood disorder ([facility]) 08/04/2012   Hyperlipidemia with target low density lipoprotein (LDL) cholesterol less than 100 mg/dL 91/47/8295   Family history of heart disease in female family member before age 52 10/07/2011    Past Surgical History:  Procedure Laterality Date   ABDOMINAL HYSTERECTOMY  05/23/2016   CYSTOSCOPY  05/23/2017   Procedure: CYSTOSCOPY;  Surgeon: Ward, Elenora Fender, MD;  Location: [facility];  Service: Gynecology;;   ELBOW SURGERY     left elbow   LAPAROSCOPIC APPENDECTOMY  10/23/2012   Procedure: APPENDECTOMY LAPAROSCOPIC;  Surgeon: Velora Heckler, MD;  Location: [facility];  Service:  General;  Laterality: N/A;   LAPAROSCOPIC HYSTERECTOMY N/A 05/23/2017   Procedure: HYSTERECTOMY TOTAL LAPAROSCOPIC;  Surgeon: Ward, Elenora Fender, MD;  Location: [facility];  Service: Gynecology;  Laterality: N/A;   LAPAROSCOPIC OVARIAN CYSTECTOMY     LAPAROSCOPIC UNILATERAL SALPINGECTOMY Left 05/23/2017   Procedure: LAPAROSCOPIC UNILATERAL SALPINGECTOMY;  Surgeon: Ward, Elenora Fender, MD;  Location: [facility];  Service: Gynecology;  Laterality: Left;   LAPAROSCOPIC UNILATERAL SALPINGO OOPHERECTOMY Right 05/23/2017   Procedure: LAPAROSCOPIC UNILATERAL SALPINGO OOPHORECTOMY;  Surgeon: Ward, Elenora Fender, MD;  Location: [facility];  Service: Gynecology;  Laterality: Right;   LEEP     RHINOPLASTY  age 57   due to broken nose   TONSILLECTOMY     age 17    OB History   No obstetric history on file.      Home Medications    Prior to Admission medications   Medication Sig Start Date End Date Taking? Authorizing Provider  diazepam (VALIUM) 2 MG tablet Take by mouth. 12/21/17  Yes [provider]  fluticasone (FLONASE) 50 MCG/ACT nasal spray Place 1 spray into both nostrils daily as needed for allergies or rhinitis.   Yes [provider]  gabapentin (NEURONTIN) 300 MG capsule Take 1,200 mg by mouth at bedtime.    Yes [provider]  LAMICTAL 200 MG tablet Take 1 tablet (200 mg total) by mouth daily. 10/29/19  Yes  Jomarie Longs, MD  levonorgestrel (MIRENA, 52 MG,) 20 MCG/DAY IUD Take by intrauterine route.   Yes [provider]  Magnesium 250 MG TABS Take by mouth.   Yes [provider]  potassium chloride (KCL) 2 mEq/mL SOLN oral liquid Take by mouth.   Yes [provider]  sulfamethoxazole-trimethoprim (BACTRIM DS) 800-160 MG tablet SMARTSIG:1 Tablet(s) By Mouth Every 12 Hours 04/23/23  Yes [provider]  tobramycin (TOBREX) 0.3 % ophthalmic solution    Yes [provider]  valACYclovir (VALTREX) 1000 MG tablet    Yes [provider]  valACYclovir (VALTREX) 500 MG tablet    Yes [provider]  predniSONE (DELTASONE) 10 MG tablet Take 6 tabs by mouth on the first day and decrease by 1 tablet for the next 5 days. Patient not taking: Reported on 11/26/2021 08/19/20   Gareth Morgan    Family History Family History  Problem Relation Age of Onset   Heart disease Father    Hypertension Father    Stroke Maternal Grandmother    Cancer Maternal Grandmother        ovarian   Stroke Paternal Grandmother    Depression Paternal Grandmother    Cancer Paternal Grandmother        breast, ovarian   Breast cancer Paternal Grandmother    Cancer Maternal Aunt        ovarian   Stroke Maternal Grandfather    Stroke Paternal Grandfather     Social History Social History   Tobacco Use   Smoking status: Every Day    Packs/day: 1.50    Years: 25.00    Additional pack years: 0.00    Total pack years: 37.50    Types: Cigarettes   Smokeless tobacco: Never  Vaping Use   Vaping Use: Never used  Substance Use Topics   Alcohol use: Not Currently    Comment: 3-4 drinks a month   Drug use: No     Allergies   Other, Atorvastatin, Hydrocodone, Nitrous oxide, Omeprazole, Sulfa antibiotics, Wellbutrin [bupropion], and Tessalon perles [benzonatate]   Review of Systems Review of Systems  Genitourinary:  Positive for flank pain and pelvic pain. Negative for decreased urine volume, difficulty urinating, dyspareunia, dysuria, enuresis, frequency, genital sores, hematuria, menstrual problem, urgency, vaginal bleeding, vaginal discharge and vaginal pain.     Physical Exam Triage Vital Signs ED Triage Vitals  Enc Vitals Group     BP 04/28/23 1741 134/85     Pulse Rate 04/28/23 1741 74     Resp 04/28/23 1741 16     Temp 04/28/23 1741 98.4 F (36.9 C)     Temp Source 04/28/23 1741 Oral     SpO2 04/28/23 1741 96 %     Weight 04/28/23 1740 210 lb (95.3 kg)     Height 04/28/23 1740 5\' 4"  (1.626 m)      Head Circumference --      Peak Flow --      Pain Score 04/28/23 1746 4     Pain Loc --      Pain Edu? --      Excl. in [place]? --    No data found.  Updated Vital Signs BP 134/85 (BP Location: Left Arm)   Pulse 74   Temp 98.4 F (36.9 C) (Oral)   Resp 16   Ht 5\' 4"  (1.626 m)   Wt 210 lb (95.3 kg)   LMP  (LMP Unknown)   SpO2 96%   BMI  36.05 kg/m   Visual Acuity Right Eye Distance:   Left Eye Distance:   Bilateral Distance:    Right Eye Near:   Left Eye Near:    Bilateral Near:     Physical Exam Constitutional:      Appearance: Normal appearance. She is well-developed.  Pulmonary:     Effort: Pulmonary effort is normal.  Abdominal:     General: Bowel sounds are normal.     Palpations: Abdomen is soft.     Tenderness: There is abdominal tenderness in the right lower quadrant, left upper quadrant and left lower quadrant.  Neurological:     Mental Status: She is alert and oriented to person, place, and time. Mental status is at baseline.      UC Treatments / Results  Labs (all labs ordered are listed, but only abnormal results are displayed) Labs Reviewed  WET PREP, GENITAL - Abnormal; Notable for the following components:      Result Value   Clue Cells Wet Prep HPF POC PRESENT (*)    WBC, Wet Prep HPF POC <10 (*)    All other components within normal limits  URINALYSIS, W/ REFLEX TO CULTURE (INFECTION SUSPECTED) - Abnormal; Notable for the following components:   Ketones, ur 15 (*)    Bacteria, UA RARE (*)    All other components within normal limits    EKG   Radiology No results found.  Procedures Procedures (including critical care time)  Medications Ordered in UC Medications - No data to display  Initial Impression / Assessment and Plan / UC Course  I have reviewed the triage vital signs and the nursing notes.  Pertinent labs & imaging results that were available during my care of the patient were reviewed by me and considered in my medical  decision making (see chart for details).  Bacterial vaginosis  Confirmed on wet prep, most likely a result of antibiotic use over the past month, negative for yeast and trichomoniasis ,urinalysis negative, advised completion of Bactrim, metronidazole prescribed and Diflucan sent to pharmacy prophylactically, discussed administration, advised against alcohol use during treatment, given written handout of additional supportive measures, may follow-up with his urgent care as needed Final Clinical Impressions(s) / UC Diagnoses   Final diagnoses:  None   Discharge Instructions   None    ED Prescriptions   None    PDMP not reviewed this encounter.   Valinda Hoar, NP 04/28/23 [phone]

## 2023-04-29 ENCOUNTER — Ambulatory Visit: Payer: Self-pay

## 2023-05-22 ENCOUNTER — Ambulatory Visit
Admission: RE | Admit: 2023-05-22 | Discharge: 2023-05-22 | Disposition: A | Discharge status: Home / Self Care | Payer: No Typology Code available for payment source | Source: Ambulatory Visit | Attending: Internal Medicine | Admitting: Internal Medicine

## 2023-05-22 VITALS — BP 126/85 | HR 81 | Temp 99.0°F | Ht 64.0 in | Wt 205.0 lb

## 2023-05-22 DIAGNOSIS — B9689 Other specified bacterial agents as the cause of diseases classified elsewhere: Secondary | ICD-10-CM | POA: Diagnosis not present

## 2023-05-22 DIAGNOSIS — B3731 Acute candidiasis of vulva and vagina: Secondary | ICD-10-CM

## 2023-05-22 DIAGNOSIS — N760 Acute vaginitis: Secondary | ICD-10-CM | POA: Diagnosis not present

## 2023-05-22 LAB — URINALYSIS, W/ REFLEX TO CULTURE (INFECTION SUSPECTED)
Bilirubin Urine: NEGATIVE
Glucose, UA: NEGATIVE mg/dL
Hgb urine dipstick: NEGATIVE
Ketones, ur: NEGATIVE mg/dL
Leukocytes,Ua: NEGATIVE
Nitrite: NEGATIVE
Protein, ur: NEGATIVE mg/dL
Specific Gravity, Urine: 1.015 (ref 1.005–1.030)
WBC, UA: NONE SEEN WBC/hpf (ref 0–5)
pH: 7 (ref 5.0–8.0)

## 2023-05-22 LAB — WET PREP, GENITAL
Sperm: NONE SEEN
Trich, Wet Prep: NONE SEEN
WBC, Wet Prep HPF POC: <10 (ref <10)

## 2023-05-22 MED ORDER — METRONIDAZOLE 500 MG PO TABS: 500.0000 mg | ORAL_TABLET | Freq: Two times a day (BID) | ORAL | 0 refills | Status: DC

## 2023-05-22 MED ORDER — FLUCONAZOLE 150 MG PO TABS: 150.0000 mg | ORAL_TABLET | Freq: Every day | ORAL | 0 refills | Status: DC

## 2023-05-22 NOTE — ED Provider Notes (Signed)
[facility]    CSN: [id number] Arrival date & time: 05/22/23  1120      History   Chief Complaint Chief Complaint  Patient presents with   Urinary Frequency    HPI Stacy Mckinney is a 52 y.o. female presents to the urgent care for evaluation of urinary frequency, burning.  No fevers, back pain nausea or vomiting.  Had a history of BV 1 month ago.  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   Panic attacks 05/31/2019   History of abnormal cervical Pap smear 04/01/2014   Tobacco use disorder 01/25/2014   Herpes labialis 08/04/2012   Mood disorder (HCC) 08/04/2012   Hyperlipidemia with target low density lipoprotein (LDL) cholesterol less than 100 mg/dL 12/01/7251   Family history of heart disease in female family member before age 40 10/07/2011    Past Surgical History:  Procedure Laterality Date   ABDOMINAL HYSTERECTOMY  05/23/2016   CYSTOSCOPY  05/23/2017   Procedure: CYSTOSCOPY;  Surgeon: Ward, Elenora Fender, MD;  Location: [facility];  Service: Gynecology;;   ELBOW SURGERY     left elbow   LAPAROSCOPIC APPENDECTOMY  10/23/2012   Procedure: APPENDECTOMY LAPAROSCOPIC;  Surgeon: Velora Heckler, MD;  Location: [facility];  Service: General;  Laterality: N/A;   LAPAROSCOPIC HYSTERECTOMY N/A 05/23/2017   Procedure: HYSTERECTOMY TOTAL LAPAROSCOPIC;  Surgeon: Ward, Elenora Fender, MD;  Location: [facility];  Service: Gynecology;  Laterality: N/A;   LAPAROSCOPIC OVARIAN CYSTECTOMY     LAPAROSCOPIC UNILATERAL SALPINGECTOMY Left 05/23/2017   Procedure: LAPAROSCOPIC UNILATERAL SALPINGECTOMY;   Surgeon: Ward, Elenora Fender, MD;  Location: [facility];  Service: Gynecology;  Laterality: Left;   LAPAROSCOPIC UNILATERAL SALPINGO OOPHERECTOMY Right 05/23/2017   Procedure: LAPAROSCOPIC UNILATERAL SALPINGO OOPHORECTOMY;  Surgeon: Ward, Elenora Fender, MD;  Location: [facility];  Service: Gynecology;  Laterality: Right;   LEEP     RHINOPLASTY  age 71   due to broken nose   TONSILLECTOMY     age 47    OB History   No obstetric history on file.      Home Medications    Prior to Admission medications   Medication Sig Start Date End Date Taking? Authorizing Provider  diazepam (VALIUM) 2 MG tablet Take by mouth. 12/21/17  Yes [provider]  gabapentin (NEURONTIN) 300 MG capsule Take 1,200 mg by mouth at bedtime.    Yes [provider]  LAMICTAL 200 MG tablet Take 1 tablet (200 mg total) by mouth daily. 10/29/19  Yes Jomarie Longs, MD  Magnesium 250 MG TABS Take by mouth.   Yes [provider]  potassium chloride (KCL) 2 mEq/mL SOLN oral liquid Take by mouth.   Yes [provider]  tobramycin (TOBREX) 0.3 % ophthalmic solution    Yes [provider]  valACYclovir (VALTREX) 1000 MG tablet    Yes [provider]  valACYclovir (VALTREX) 500 MG tablet    Yes [provider]  fluticasone (FLONASE) 50 MCG/ACT nasal  spray Place 1 spray into both nostrils daily as needed for allergies or rhinitis.    [provider]  levonorgestrel (MIRENA, 52 MG,) 20 MCG/DAY IUD Take by intrauterine route.    [provider]  metroNIDAZOLE (FLAGYL) 500 MG tablet Take 1 tablet (500 mg total) by mouth 2 (two) times daily. 04/28/23   White, Elita Boone, NP  predniSONE (DELTASONE) 10 MG tablet Take 6 tabs by mouth on the first day and decrease by 1 tablet for the next 5 days. Patient not taking: Reported on 11/26/2021 08/19/20   Eusebio Friendly B, PA-C  sulfamethoxazole-trimethoprim (BACTRIM DS) 800-160 MG tablet SMARTSIG:1 Tablet(s) By Mouth Every 12  Hours 04/23/23   [provider]    Family History Family History  Problem Relation Age of Onset   Heart disease Father    Hypertension Father    Stroke Maternal Grandmother    Cancer Maternal Grandmother        ovarian   Stroke Paternal Grandmother    Depression Paternal Grandmother    Cancer Paternal Grandmother        breast, ovarian   Breast cancer Paternal Grandmother    Cancer Maternal Aunt        ovarian   Stroke Maternal Grandfather    Stroke Paternal Grandfather     Social History Social History   Tobacco Use   Smoking status: Former    Packs/day: 1.50    Years: 25.00    Additional pack years: 0.00    Total pack years: 37.50    Types: Cigarettes   Smokeless tobacco: Never  Vaping Use   Vaping Use: Never used  Substance Use Topics   Alcohol use: Not Currently    Comment: 3-4 drinks a month   Drug use: No     Allergies   Other, Atorvastatin, Hydrocodone, Nitrous oxide, Omeprazole, Sulfa antibiotics, Wellbutrin [bupropion], and Tessalon perles [benzonatate]   Review of Systems Review of Systems   Physical Exam Triage Vital Signs ED Triage Vitals  Enc Vitals Group     BP 05/22/23 1135 126/85     Pulse Rate 05/22/23 1135 81     Resp --      Temp 05/22/23 1135 99 F (37.2 C)     Temp Source 05/22/23 1135 Oral     SpO2 05/22/23 1135 94 %     Weight 05/22/23 1133 205 lb (93 kg)     Height 05/22/23 1133 5\' 4"  (1.626 m)     Head Circumference --      Peak Flow --      Pain Score 05/22/23 1132 4     Pain Loc --      Pain Edu? --      Excl. in GC? --    No data found.  Updated Vital Signs BP 126/85 (BP Location: Left Arm)   Pulse 81   Temp 99 F (37.2 C) (Oral)   Ht 5\' 4"  (1.626 m)   Wt 205 lb (93 kg)   LMP  (LMP Unknown)   SpO2 94%   BMI 35.19 kg/m   Visual Acuity Right Eye Distance:   Left Eye Distance:   Bilateral Distance:    Right Eye Near:   Left Eye Near:    Bilateral Near:     Physical Exam Constitutional:       Appearance: She is well-developed.  HENT:     Head: Normocephalic and atraumatic.  Eyes:     Conjunctiva/sclera: Conjunctivae normal.  Cardiovascular:  Rate and Rhythm: Normal rate.  Pulmonary:     Effort: Pulmonary effort is normal. No respiratory distress.  Abdominal:     Tenderness: There is no right CVA tenderness or left CVA tenderness.  Musculoskeletal:        General: Normal range of motion.     Cervical back: Normal range of motion.  Skin:    General: Skin is warm.     Findings: No rash.  Neurological:     Mental Status: She is alert and oriented to person, place, and time.  Psychiatric:        Behavior: Behavior normal.        Thought Content: Thought content normal.      UC Treatments / Results  Labs (all labs ordered are listed, but only abnormal results are displayed) Labs Reviewed  URINALYSIS, W/ REFLEX TO CULTURE (INFECTION SUSPECTED) - Abnormal; Notable for the following components:      Result Value   Bacteria, UA RARE (*)    All other components within normal limits    EKG   Radiology No results found.  Procedures Procedures (including critical care time)  Medications Ordered in UC Medications - No data to display  Initial Impression / Assessment and Plan / UC Course  I have reviewed the triage vital signs and the nursing notes.  Pertinent labs & imaging results that were available during my care of the patient were reviewed by me and considered in my medical decision making (see chart for details).     52 year old female, urinalysis shows no sign of UTI.  No WBCs, hemoglobin, leukocytes or nitrites.  There was rare bacteria present as well as budding yeast.  Self wet prep was performed by patient and noted to have clue cells along with yeast.  Patient was treated for BV with metronidazole and is also given fluconazole tablets to take up to 4 doses as needed.  She understands signs symptoms return to urgent care for. Final Clinical  Impressions(s) / UC Diagnoses   Final diagnoses:  None   Discharge Instructions   None    ED Prescriptions   None    PDMP not reviewed this encounter.   Evon Slack, [place] 05/22/23 1311

## 2023-05-22 NOTE — ED Triage Notes (Signed)
Pt c/o urinary issues since April.   Pt has tried dietary changes but felt that symptoms returned on Thursday  Pt is experiencing urgency and cramping

## 2023-09-12 ENCOUNTER — Ambulatory Visit (INDEPENDENT_AMBULATORY_CARE_PROVIDER_SITE_OTHER): Payer: No Typology Code available for payment source

## 2023-09-12 ENCOUNTER — Ambulatory Visit
Admission: EM | Admit: 2023-09-12 | Discharge: 2023-09-12 | Disposition: A | Discharge status: Home / Self Care | Payer: No Typology Code available for payment source | Attending: Internal Medicine | Admitting: Internal Medicine

## 2023-09-12 DIAGNOSIS — S93402A Sprain of unspecified ligament of left ankle, initial encounter: Secondary | ICD-10-CM | POA: Diagnosis not present

## 2023-09-12 DIAGNOSIS — M79672 Pain in left foot: Secondary | ICD-10-CM

## 2023-09-12 DIAGNOSIS — M25572 Pain in left ankle and joints of left foot: Secondary | ICD-10-CM

## 2023-09-12 DIAGNOSIS — S9032XA Contusion of left foot, initial encounter: Secondary | ICD-10-CM | POA: Diagnosis not present

## 2023-09-12 NOTE — ED Provider Notes (Signed)
[facility]    CSN: [id number] Arrival date & time: 09/12/23  4166      History   Chief Complaint Chief Complaint  Patient presents with   Foot Injury    HPI Stacy Mckinney is a 52 y.o. female presents for ankle and foot pain.  Patient states today she tripped over a rock twisting her left ankle which did cause the rock to flip and fall onto the top of her left foot as well.  She reports she is able to bear weight initially but now cannot bear weight on the foot or ankle.  Does endorse swelling but denies bruising or numbness or tingling.  No history of injuries or surgeries to the ankle or foot in the past.  She took 2 Aleve and gabapentin for her pain.  No other injuries or concerns at this time.   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   Panic attacks 05/31/2019   History of abnormal cervical Pap smear 04/01/2014   Tobacco use disorder 01/25/2014   Herpes labialis 08/04/2012   Mood disorder (HCC) 08/04/2012   Hyperlipidemia with target low density lipoprotein (LDL) cholesterol less than 100 mg/dL 05/28/1600   Family history of heart disease in female family member before age 59 10/07/2011    Past Surgical History:  Procedure Laterality Date   ABDOMINAL HYSTERECTOMY  05/23/2016   CYSTOSCOPY  05/23/2017   Procedure: CYSTOSCOPY;  Surgeon: Ward, Elenora Fender, MD;  Location: [facility];  Service: Gynecology;;   ELBOW SURGERY     left elbow   LAPAROSCOPIC APPENDECTOMY  10/23/2012   Procedure: APPENDECTOMY LAPAROSCOPIC;  Surgeon: Velora Heckler, MD;  Location: [facility];  Service: General;  Laterality: N/A;   LAPAROSCOPIC HYSTERECTOMY N/A 05/23/2017   Procedure: HYSTERECTOMY TOTAL LAPAROSCOPIC;  Surgeon: Ward, Elenora Fender, MD;  Location: [facility];  Service: Gynecology;  Laterality: N/A;   LAPAROSCOPIC OVARIAN CYSTECTOMY     LAPAROSCOPIC UNILATERAL SALPINGECTOMY Left 05/23/2017   Procedure: LAPAROSCOPIC UNILATERAL SALPINGECTOMY;  Surgeon: Ward, Elenora Fender, MD;  Location: [facility];  Service: Gynecology;  Laterality: Left;   LAPAROSCOPIC UNILATERAL SALPINGO OOPHERECTOMY Right 05/23/2017   Procedure: LAPAROSCOPIC UNILATERAL SALPINGO OOPHORECTOMY;  Surgeon: Ward, Elenora Fender, MD;  Location: [facility];  Service: Gynecology;  Laterality: Right;   LEEP     RHINOPLASTY  age 73   due to broken nose   TONSILLECTOMY     age 47    OB History   No obstetric history on file.      Home Medications    Prior to Admission medications   Medication Sig Start Date End Date Taking? Authorizing Provider  diazepam (VALIUM) 2 MG tablet Take by mouth. 12/21/17  Yes [provider]  gabapentin (NEURONTIN) 300 MG capsule Take 1,200 mg by mouth at bedtime.    Yes [provider]  LAMICTAL 200 MG tablet Take 1 tablet (200 mg total) by mouth daily. 10/29/19  Yes Jomarie Longs, MD  fluconazole (DIFLUCAN) 150 MG tablet Take 1 tablet (150 mg total) by mouth daily. Take 1 tablet by mouth every 72 hours up to 4 doses 05/22/23   Evon Slack, PA-C  fluticasone [facility]) 50 MCG/ACT nasal spray Place 1 spray into both nostrils daily as needed for allergies or rhinitis.    [provider]  levonorgestrel (MIRENA, 52 MG,) 20 MCG/DAY IUD Take by intrauterine route.    [provider]  Magnesium 250 MG TABS Take by mouth.    [provider]  metroNIDAZOLE (FLAGYL) 500 MG tablet Take 1 tablet (500 mg total) by mouth 2 (two) times daily. 05/22/23   Evon Slack, PA-C  potassium chloride (KCL) 2 mEq/mL SOLN oral liquid Take by mouth.    [provider]  sulfamethoxazole-trimethoprim (BACTRIM DS) 800-160 MG tablet SMARTSIG:1 Tablet(s) By Mouth Every 12 Hours 04/23/23   [provider]  tobramycin (TOBREX) 0.3 % ophthalmic solution     [provider]  valACYclovir (VALTREX) 1000 MG tablet     [provider]  valACYclovir (VALTREX) 500 MG tablet     [provider]    Family History Family History  Problem Relation Age of Onset   Heart disease Father    Hypertension Father    Stroke Maternal Grandmother    Cancer Maternal Grandmother        ovarian   Stroke Paternal Grandmother    Depression Paternal Grandmother    Cancer Paternal Grandmother        breast, ovarian   Breast cancer Paternal Grandmother    Cancer Maternal Aunt        ovarian   Stroke Maternal Grandfather    Stroke Paternal Grandfather     Social History Social History   Tobacco Use   Smoking status: Former    Current packs/day: 1.50    Average packs/day: 1.5 packs/day for 25.0 years (37.5 ttl pk-yrs)    Types: Cigarettes   Smokeless tobacco: Never  Vaping Use   Vaping status: Never Used  Substance Use Topics   Alcohol use: Not Currently    Comment: 3-4 drinks a month   Drug use: No     Allergies   Other, Atorvastatin, Hydrocodone, Nitrous oxide, Omeprazole, Sulfa antibiotics, Wellbutrin [bupropion], and Tessalon perles [benzonatate]   Review of Systems Review of Systems  Musculoskeletal:        Left ankle and foot pain      Physical Exam Triage Vital Signs ED Triage Vitals  Encounter Vitals Group     BP 09/12/23 1107 125/84     Systolic BP Percentile --      Diastolic BP Percentile --      Pulse Rate 09/12/23 1107 82     Resp 09/12/23 1107 18     Temp 09/12/23 1107 97.8 F (36.6 C)     Temp Source 09/12/23 1107 Oral     SpO2 09/12/23 1107 99 %     Weight --      Height --      Head Circumference --      Peak Flow --      Pain Score 09/12/23 1106 10     Pain Loc --      Pain  Education --      Exclude from Growth Chart --    No data found.  Updated Vital Signs BP 125/84 (BP Location: Left Arm)   Pulse 82   Temp 97.8 F (36.6 C) (Oral)  Resp 18   LMP  (LMP Unknown)   SpO2 99%   Visual Acuity Right Eye Distance:   Left Eye Distance:   Bilateral Distance:    Right Eye Near:   Left Eye Near:    Bilateral Near:     Physical Exam Vitals and nursing note reviewed.  Constitutional:      General: She is not in acute distress.    Appearance: Normal appearance. She is not ill-appearing.  HENT:     Head: Normocephalic and atraumatic.  Eyes:     Pupils: Pupils are equal, round, and reactive to light.  Cardiovascular:     Rate and Rhythm: Normal rate.  Pulmonary:     Effort: Pulmonary effort is normal.  Musculoskeletal:     Left ankle: Swelling present. No deformity, ecchymosis or lacerations. Tenderness present over the lateral malleolus, medial malleolus and base of 5th metatarsal. Decreased range of motion. Normal pulse.     Left foot: Decreased range of motion. Swelling, tenderness and bony tenderness present. No deformity, bunion, Charcot foot, foot drop, prominent metatarsal heads or laceration. Normal pulse.     Comments: Pain with dorsi flexion and extension of the foot.  Skin:    General: Skin is warm and dry.  Neurological:     General: No focal deficit present.     Mental Status: She is alert and oriented to person, place, and time.  Psychiatric:        Mood and Affect: Mood normal.        Behavior: Behavior normal.      UC Treatments / Results  Labs (all labs ordered are listed, but only abnormal results are displayed) Labs Reviewed - No data to display  EKG   Radiology No results found.  Procedures Procedures (including critical care time)  Medications Ordered in UC Medications - No data to display  Initial Impression / Assessment and Plan / UC Course  I have reviewed the triage vital signs and the nursing  notes.  Pertinent labs & imaging results that were available during my care of the patient were reviewed by me and considered in my medical decision making (see chart for details).     Reviewed exam and symptoms with patient.  No red flags.  Wet read of ankle and foot x-rays without fracture.  Will contact patient for any positive results with radiology overread.  Discussed ankle sprain/foot contusion.  Ace wrap applied to foot and Aircast applied to ankle.  Discussed RICE therapy.  She can continue over-the-counter analgesics as needed.  PCP follow-up 1 week if symptoms do not improve.  ER precautions reviewed. Final Clinical Impressions(s) / UC Diagnoses   Final diagnoses:  Left foot pain  Acute left ankle pain  Contusion of left foot, initial encounter  Sprain of left ankle, unspecified ligament, initial encounter     Discharge Instructions      You may use the Ace wrap/ankle Aircast to help stabilize your foot and ankle as well as top with compression/swelling.  Continue elevation and ice as needed.  You may continue Tylenol, ibuprofen, Aleve as needed for pain.  Please follow-up with your PCP in 1 week if your symptoms are not improving.  Please go to the ER if you develop any worsening symptoms.  I hope you feel better soon!     ED Prescriptions   None    PDMP not reviewed this encounter.   Radford Pax, NP 09/12/23 1253

## 2023-09-12 NOTE — ED Triage Notes (Signed)
Patient states a rock fell on her left foot today.

## 2023-09-12 NOTE — Discharge Instructions (Addendum)
You may use the Ace wrap/ankle Aircast to help stabilize your foot and ankle as well as top with compression/swelling.  Continue elevation and ice as needed.  You may continue Tylenol, ibuprofen, Aleve as needed for pain.  Please follow-up with your PCP in 1 week if your symptoms are not improving.  Please go to the ER if you develop any worsening symptoms.  I hope you feel better soon!

## 2023-12-09 ENCOUNTER — Ambulatory Visit
Admission: RE | Admit: 2023-12-09 | Discharge: 2023-12-09 | Disposition: A | Discharge status: Home / Self Care | Payer: No Typology Code available for payment source | Source: Ambulatory Visit | Attending: Emergency Medicine | Admitting: Emergency Medicine

## 2023-12-09 VITALS — BP 143/89 | HR 91 | Temp 97.7°F | Resp 14 | Ht 64.0 in | Wt 205.0 lb

## 2023-12-09 DIAGNOSIS — N390 Urinary tract infection, site not specified: Secondary | ICD-10-CM | POA: Diagnosis not present

## 2023-12-09 DIAGNOSIS — B9689 Other specified bacterial agents as the cause of diseases classified elsewhere: Secondary | ICD-10-CM

## 2023-12-09 DIAGNOSIS — N760 Acute vaginitis: Secondary | ICD-10-CM

## 2023-12-09 LAB — URINALYSIS, W/ REFLEX TO CULTURE (INFECTION SUSPECTED)
Bilirubin Urine: NEGATIVE
Glucose, UA: NEGATIVE mg/dL
Ketones, ur: NEGATIVE mg/dL
Nitrite: NEGATIVE
Protein, ur: NEGATIVE mg/dL
Specific Gravity, Urine: 1.015 (ref 1.005–1.030)
pH: 5.5 (ref 5.0–8.0)

## 2023-12-09 LAB — WET PREP, GENITAL
Sperm: NONE SEEN
Trich, Wet Prep: NONE SEEN
WBC, Wet Prep HPF POC: <10 — AB (ref <10)
Yeast Wet Prep HPF POC: NONE SEEN

## 2023-12-09 MED ORDER — CEFDINIR 300 MG PO CAPS: 300.0000 mg | ORAL_CAPSULE | Freq: Two times a day (BID) | ORAL | 0 refills | Status: AC

## 2023-12-09 MED ORDER — METRONIDAZOLE 500 MG PO TABS: 500.0000 mg | ORAL_TABLET | Freq: Two times a day (BID) | ORAL | 0 refills | Status: DC

## 2023-12-09 MED ORDER — FLUCONAZOLE 200 MG PO TABS: 200.0000 mg | ORAL_TABLET | ORAL | 0 refills | Status: AC

## 2023-12-09 NOTE — Discharge Instructions (Addendum)
 Take the Flagyl  (metronidazole ) 500 mg twice daily for treatment of your bacterial vaginosis.  Avoid alcohol while on the metronidazole  as taken together will cause of vomiting.  Bacterial vaginosis is often caused by a imbalance of bacteria in your vaginal vault.  This is sometimes a result of using tampons or hormonal fluctuations during her menstrual cycle.  You if your symptoms are recurrent you can try using a boric acid suppository twice weekly to help maintain the acid-base balance in your vagina vault which could prevent further infection.  You can also try vaginal probiotics to help return normal bacterial balance.   Take the cefdinir  twice daily for 7 days with food for treatment of urinary tract infection.  Use the Pyridium every 8 hours as needed for urinary discomfort.  This will turn your urine a bright red-orange.  Increase your oral fluid intake so that you increase your urine production and or flushing your urinary system.  Take an over-the-counter probiotic, such as Culturelle-Align-Activia, 1 hour after each dose of antibiotic to prevent diarrhea or yeast infections from forming.  We will culture urine and change the antibiotics if necessary.  If you develop symptoms of yeast infection start taking 1 Diflucan  at outset of symptoms and repeat dosing every 3 days for total of 3 doses.  Return for reevaluation, or see your primary care provider, for any new or worsening symptoms.

## 2023-12-09 NOTE — ED Provider Notes (Signed)
 [facility]    CSN: [id number] Arrival date & time: 12/09/23  1716      History   Chief Complaint Chief Complaint  Patient presents with   Urinary Frequency    Appointment    HPI Stacy Mckinney is a 53 y.o. female.   HPI  53 year old female with a past medical history significant for hyperlipidemia, heart murmur, bipolar disorder, anxiety, and pelvic pain in a female presents for evaluation of UTI symptoms.  Her symptoms began 2 weeks ago and she was first seen at [facility] for [facility]TI and treated with Macrobid and Diflucan .  The second time she was seen at [facility] and was treated with a second round of Diflucan  and Macrobid.  Both times her urine grew out less than 50,000 colony-forming units of E. coli.  The patient is endorsing a return of burning with urination along with urinary urgency and frequency this morning.  This is associated with low back pain and nausea.  She denies any fever, blood in her urine, vomiting, vaginal discharge, or vaginal itching.  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   Panic attacks 05/31/2019   History of abnormal cervical Pap smear 04/01/2014   Tobacco use disorder 01/25/2014   Herpes labialis 08/04/2012   Mood disorder (HCC) 08/04/2012   Hyperlipidemia with target low density lipoprotein (LDL) cholesterol less than 100 mg/dL 88/91/7987   Family history of heart disease in female family member before age 51 10/07/2011    Past Surgical History:  Procedure Laterality Date   ABDOMINAL HYSTERECTOMY  05/23/2016   CYSTOSCOPY   05/23/2017   Procedure: CYSTOSCOPY;  Surgeon: Ward, Mitzie BROCKS, MD;  Location: [facility];  Service: Gynecology;;   ELBOW SURGERY     left elbow   LAPAROSCOPIC APPENDECTOMY  10/23/2012   Procedure: APPENDECTOMY LAPAROSCOPIC;  Surgeon: Krystal CHRISTELLA Spinner, MD;  Location: [facility];  Service: General;  Laterality: N/A;   LAPAROSCOPIC HYSTERECTOMY N/A 05/23/2017   Procedure: HYSTERECTOMY TOTAL LAPAROSCOPIC;  Surgeon: Ward, Mitzie BROCKS, MD;  Location: [facility];  Service: Gynecology;  Laterality: N/A;   LAPAROSCOPIC OVARIAN CYSTECTOMY     LAPAROSCOPIC UNILATERAL SALPINGECTOMY Left 05/23/2017   Procedure: LAPAROSCOPIC UNILATERAL SALPINGECTOMY;  Surgeon: Ward, Mitzie BROCKS, MD;  Location: [facility];  Service: Gynecology;  Laterality: Left;   LAPAROSCOPIC UNILATERAL SALPINGO OOPHERECTOMY Right 05/23/2017   Procedure: LAPAROSCOPIC UNILATERAL SALPINGO OOPHORECTOMY;  Surgeon: Ward, Mitzie BROCKS, MD;  Location: [facility];  Service: Gynecology;  Laterality: Right;   LEEP     RHINOPLASTY  age 28   due to broken nose   TONSILLECTOMY     age 63    OB History   No obstetric history on file.      Home Medications    Prior to Admission medications   Medication Sig Start Date End Date Taking? Authorizing Provider  cefdinir  (OMNICEF ) 300 MG capsule Take 1 capsule (300 mg total) by mouth 2 (two) times daily for 7 days. 12/09/23 12/16/23 Yes  Bernardino Ditch, NP  fluconazole  (DIFLUCAN ) 200 MG tablet Take 1 tablet (200 mg total) by mouth every 3 (three) days for 3 doses. 12/09/23 12/16/23 Yes Bernardino Ditch, NP  metroNIDAZOLE  (FLAGYL ) 500 MG tablet Take 1 tablet (500 mg total) by mouth 2 (two) times daily. 12/09/23  Yes Bernardino Ditch, NP  diazepam  (VALIUM ) 2 MG tablet Take by mouth. 12/21/17   [provider]  fluticasone (FLONASE) 50 MCG/ACT nasal spray Place 1 spray into both nostrils daily as needed for allergies or rhinitis.    [provider]  gabapentin (NEURONTIN) 300 MG capsule Take 1,200 mg by mouth at bedtime.      [provider]  LAMICTAL  200 MG tablet Take 1 tablet (200 mg total) by mouth daily. 10/29/19   Eappen, Saramma, MD  levonorgestrel (MIRENA, 52 MG,) 20 MCG/DAY IUD Take by intrauterine route.    [provider]  Magnesium 250 MG TABS Take by mouth.    [provider]  potassium chloride  (KCL) 2 mEq/mL SOLN oral liquid Take by mouth.    [provider]  tobramycin (TOBREX) 0.3 % ophthalmic solution     [provider]  valACYclovir  (VALTREX ) 1000 MG tablet     [provider]  valACYclovir  (VALTREX ) 500 MG tablet     [provider]    Family History Family History  Problem Relation Age of Onset   Heart disease Father    Hypertension Father    Stroke Maternal Grandmother    Cancer Maternal Grandmother        ovarian   Stroke Paternal Grandmother    Depression Paternal Grandmother    Cancer Paternal Grandmother        breast, ovarian   Breast cancer Paternal Grandmother    Cancer Maternal Aunt        ovarian   Stroke Maternal Grandfather    Stroke Paternal Grandfather     Social History Social History   Tobacco Use   Smoking status: Former    Current packs/day: 1.50    Average packs/day: 1.5 packs/day for 25.0 years (37.5 ttl pk-yrs)    Types: Cigarettes   Smokeless tobacco: Never  Vaping Use   Vaping status: Never Used  Substance Use Topics   Alcohol use: Not Currently    Comment: 3-4 drinks a month   Drug use: No     Allergies   Other, Atorvastatin, Hydrocodone , Nitrous oxide, Omeprazole, Sulfa antibiotics, Wellbutrin [bupropion], and Tessalon perles [benzonatate]   Review of Systems Review of Systems  Constitutional:  Negative for fever.  Gastrointestinal:  Negative for abdominal pain, nausea and vomiting.  Genitourinary:  Positive for dysuria, frequency and urgency. Negative for hematuria, vaginal discharge and vaginal pain.  Musculoskeletal:  Positive for back pain.     Physical Exam Triage  Vital Signs ED Triage Vitals [12/09/23 1721]  Encounter Vitals Group     BP      Systolic BP Percentile      Diastolic BP Percentile      Pulse      Resp      Temp      Temp src      SpO2      Weight 205 lb 0.4 oz (93 kg)     Height 5' 4 (1.626 m)     Head Circumference      Peak Flow      Pain Score 4     Pain Loc      Pain Education  Exclude from Growth Chart    No data found.  Updated Vital Signs BP (!) 143/89 (BP Location: Right Arm)   Pulse 91   Temp 97.7 F (36.5 C) (Oral)   Resp 14   Ht 5' 4 (1.626 m)   Wt 205 lb 0.4 oz (93 kg)   LMP  (LMP Unknown)   SpO2 98%   BMI 35.19 kg/m   Visual Acuity Right Eye Distance:   Left Eye Distance:   Bilateral Distance:    Right Eye Near:   Left Eye Near:    Bilateral Near:     Physical Exam Vitals and nursing note reviewed.  Constitutional:      Appearance: Normal appearance. She is not ill-appearing.  HENT:     Head: Normocephalic and atraumatic.  Cardiovascular:     Rate and Rhythm: Normal rate and regular rhythm.     Pulses: Normal pulses.     Heart sounds: Normal heart sounds. No murmur heard.    No friction rub. No gallop.  Pulmonary:     Effort: Pulmonary effort is normal.     Breath sounds: Normal breath sounds. No wheezing, rhonchi or rales.  Abdominal:     Tenderness: There is no right CVA tenderness or left CVA tenderness.  Skin:    General: Skin is warm and dry.     Capillary Refill: Capillary refill takes less than 2 seconds.     Findings: No rash.  Neurological:     General: No focal deficit present.     Mental Status: She is alert and oriented to person, place, and time.      UC Treatments / Results  Labs (all labs ordered are listed, but only abnormal results are displayed) Labs Reviewed  WET PREP, GENITAL - Abnormal; Notable for the following components:      Result Value   Clue Cells Wet Prep HPF POC PRESENT (*)    WBC, Wet Prep HPF POC <10 (*)    All other components within  normal limits  URINALYSIS, W/ REFLEX TO CULTURE (INFECTION SUSPECTED) - Abnormal; Notable for the following components:   Hgb urine dipstick SMALL (*)    Leukocytes,Ua TRACE (*)    Bacteria, UA FEW (*)    All other components within normal limits  URINE CULTURE    EKG   Radiology No results found.  Procedures Procedures (including critical care time)  Medications Ordered in UC Medications - No data to display  Initial Impression / Assessment and Plan / UC Course  I have reviewed the triage vital signs and the nursing notes.  Pertinent labs & imaging results that were available during my care of the patient were reviewed by me and considered in my medical decision making (see chart for details).   Patient is a pleasant, nontoxic-appearing 54 year old female presenting for evaluation of ongoing UTI symptoms which been present since around Christmas time.  She has had 2 separate rounds of Macrobid and Diflucan  for treatment of her urinary tract infection.  She has had 2 cultures that have both grown out E. coli.  Her symptoms returned today.  I will order urinalysis to evaluate for the presence of UTI but I will also order a vaginal wet prep to evaluate for the presence of bacterial vaginosis or vaginal yeast infection which could also be contributing to the patient's UTI symptoms.  Urinalysis shows small hemoglobin with trace leukocyte esterase but is negative for nitrites, protein, or ketones.  Also no glucose.  Reflex  microscopy shows 11-20 WBCs with few bacteria and WBC clumps.  Urine will reflex to culture.  Vaginal wet prep is positive for clue cells.  I will discharge patient on the diagnosis of urinary tract infection on cefdinir  300 mg twice daily for 7 days for treatment of her UTI.  Pyridium every 8 hours as needed for urinary discomfort.  For the bacterial vaginosis I will treat her with metronidazole  500 mg twice daily for 7 days.   Final Clinical Impressions(s) / UC  Diagnoses   Final diagnoses:  Lower urinary tract infectious disease  BV (bacterial vaginosis)     Discharge Instructions      Take the Flagyl  (metronidazole ) 500 mg twice daily for treatment of your bacterial vaginosis.  Avoid alcohol while on the metronidazole  as taken together will cause of vomiting.  Bacterial vaginosis is often caused by a imbalance of bacteria in your vaginal vault.  This is sometimes a result of using tampons or hormonal fluctuations during her menstrual cycle.  You if your symptoms are recurrent you can try using a boric acid suppository twice weekly to help maintain the acid-base balance in your vagina vault which could prevent further infection.  You can also try vaginal probiotics to help return normal bacterial balance.   Take the cefdinir  twice daily for 7 days with food for treatment of urinary tract infection.  Use the Pyridium every 8 hours as needed for urinary discomfort.  This will turn your urine a bright red-orange.  Increase your oral fluid intake so that you increase your urine production and or flushing your urinary system.  Take an over-the-counter probiotic, such as Culturelle-Align-Activia, 1 hour after each dose of antibiotic to prevent diarrhea or yeast infections from forming.  We will culture urine and change the antibiotics if necessary.  If you develop symptoms of yeast infection start taking 1 Diflucan  at outset of symptoms and repeat dosing every 3 days for total of 3 doses.  Return for reevaluation, or see your primary care provider, for any new or worsening symptoms.      ED Prescriptions     Medication Sig Dispense Auth. Provider   metroNIDAZOLE  (FLAGYL ) 500 MG tablet Take 1 tablet (500 mg total) by mouth 2 (two) times daily. 14 tablet Bernardino Ditch, NP   cefdinir  (OMNICEF ) 300 MG capsule Take 1 capsule (300 mg total) by mouth 2 (two) times daily for 7 days. 14 capsule Bernardino Ditch, NP   fluconazole  (DIFLUCAN ) 200 MG  tablet Take 1 tablet (200 mg total) by mouth every 3 (three) days for 3 doses. 3 tablet Bernardino Ditch, NP      PDMP not reviewed this encounter.   Bernardino Ditch, NP 12/09/23 1740

## 2023-12-09 NOTE — ED Triage Notes (Signed)
 Patient states that she has been battling a UTI for 2 weeks.  Patient reports symptoms started back today.

## 2023-12-11 LAB — URINE CULTURE: Culture: 50000 — AB

## 2024-04-25 ENCOUNTER — Ambulatory Visit
Admission: RE | Admit: 2024-04-25 | Discharge: 2024-04-25 | Disposition: A | Discharge status: Home / Self Care | Source: Ambulatory Visit | Attending: Family Medicine | Admitting: Family Medicine

## 2024-04-25 VITALS — BP 118/72 | HR 77 | Temp 97.9°F | Resp 18

## 2024-04-25 DIAGNOSIS — L03113 Cellulitis of right upper limb: Secondary | ICD-10-CM | POA: Diagnosis not present

## 2024-04-25 MED ORDER — DOXYCYCLINE HYCLATE 100 MG PO CAPS: 100.0000 mg | ORAL_CAPSULE | Freq: Two times a day (BID) | ORAL | 0 refills | Status: DC

## 2024-04-25 MED ORDER — TRIAMCINOLONE ACETONIDE 0.1 % EX OINT: 1.0000 | TOPICAL_OINTMENT | Freq: Two times a day (BID) | CUTANEOUS | 0 refills | Status: AC

## 2024-04-25 MED ORDER — FLUCONAZOLE 150 MG PO TABS: 150.0000 mg | ORAL_TABLET | Freq: Once | ORAL | 0 refills | Status: AC

## 2024-04-25 NOTE — ED Provider Notes (Signed)
 [facility]    CSN: [id number] Arrival date & time: 04/25/24  1019      History   Chief Complaint Chief Complaint  Patient presents with   Insect Bite    Entered by patient    HPI Nohelia Valenza is a 53 y.o. female.   HPI  Randa presents for concern for bug bites. She was sitting on a neighbors chair outside then was covered in bug bites. She tried AfterBite and applying neosporin. She has been scratching a lot. 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   Panic attacks 05/31/2019   History of abnormal cervical Pap smear 04/01/2014   Tobacco use disorder 01/25/2014   Herpes labialis 08/04/2012   Mood disorder (HCC) 08/04/2012   Hyperlipidemia with target low density lipoprotein (LDL) cholesterol less than 100 mg/dL 40/98/1191   Family history of heart disease in female family member before age 61 10/07/2011    Past Surgical History:  Procedure Laterality Date   ABDOMINAL HYSTERECTOMY  05/23/2016   CYSTOSCOPY  05/23/2017   Procedure: CYSTOSCOPY;  Surgeon: Ward, Margarie Shay, MD;  Location: [facility];  Service: Gynecology;;   ELBOW SURGERY     left elbow   LAPAROSCOPIC APPENDECTOMY  10/23/2012   Procedure: APPENDECTOMY LAPAROSCOPIC;  Surgeon: Keitha Pata, MD;  Location: [facility];  Service: General;  Laterality: N/A;   LAPAROSCOPIC HYSTERECTOMY N/A 05/23/2017   Procedure: HYSTERECTOMY TOTAL LAPAROSCOPIC;  Surgeon: Ward, Margarie Shay, MD;  Location: [facility];  Service: Gynecology;  Laterality: N/A;   LAPAROSCOPIC OVARIAN CYSTECTOMY      LAPAROSCOPIC UNILATERAL SALPINGECTOMY Left 05/23/2017   Procedure: LAPAROSCOPIC UNILATERAL SALPINGECTOMY;  Surgeon: Ward, Margarie Shay, MD;  Location: [facility];  Service: Gynecology;  Laterality: Left;   LAPAROSCOPIC UNILATERAL SALPINGO OOPHERECTOMY Right 05/23/2017   Procedure: LAPAROSCOPIC UNILATERAL SALPINGO OOPHORECTOMY;  Surgeon: Ward, Margarie Shay, MD;  Location: [facility];  Service: Gynecology;  Laterality: Right;   LEEP     RHINOPLASTY  age 53   due to broken nose   TONSILLECTOMY     age 65    OB History   No obstetric history on file.      Home Medications    Prior to Admission medications   Medication Sig Start Date End Date Taking? Authorizing Provider  doxycycline (VIBRAMYCIN) 100 MG capsule Take 1 capsule (100 mg total) by mouth 2 (two) times daily. 04/25/24  Yes Talal Fritchman, DO  fluconazole  (DIFLUCAN ) 150 MG tablet Take 1 tablet (150 mg total) by mouth once for 1 dose. 04/25/24 04/25/24 Yes Lamontae Ricardo, DO  triamcinolone  ointment (KENALOG ) 0.1 % Apply 1 Application topically 2 (two) times daily. 04/25/24  Yes Kaydense Rizo, DO  diazepam  (VALIUM ) 2 MG tablet Take by mouth. 12/21/17   [provider]  fluticasone (FLONASE) 50 MCG/ACT nasal spray Place 1 spray into both nostrils daily as needed for allergies or rhinitis.    [provider]  gabapentin (NEURONTIN) 300 MG capsule Take 1,200 mg by mouth at bedtime.     [provider]  LAMICTAL  200 MG tablet Take 1 tablet (200 mg total) by mouth daily. 10/29/19   Eappen, Saramma, MD  levonorgestrel (MIRENA, 52 MG,) 20 MCG/DAY IUD Take by intrauterine route.    [provider]  Magnesium 250 MG TABS Take by mouth.    [provider]  metroNIDAZOLE  (FLAGYL ) 500 MG tablet Take 1 tablet (500 mg total) by mouth 2 (two) times daily. 12/09/23   Kent Pear, NP  potassium chloride  (KCL) 2 mEq/mL SOLN oral liquid Take by mouth.    [provider]  tobramycin (TOBREX) 0.3 % ophthalmic  solution     [provider]  valACYclovir  (VALTREX ) 1000 MG tablet     [provider]  valACYclovir  (VALTREX ) 500 MG tablet     [provider]    Family History Family History  Problem Relation Age of Onset   Heart disease Father    Hypertension Father    Stroke Maternal Grandmother    Cancer Maternal Grandmother        ovarian   Stroke Paternal Grandmother    Depression Paternal Grandmother    Cancer Paternal Grandmother        breast, ovarian   Breast cancer Paternal Grandmother    Cancer Maternal Aunt        ovarian   Stroke Maternal Grandfather    Stroke Paternal Grandfather     Social History Social History   Tobacco Use   Smoking status: Former    Current packs/day: 1.50    Average packs/day: 1.5 packs/day for 25.0 years (37.5 ttl pk-yrs)    Types: Cigarettes   Smokeless tobacco: Never  Vaping Use   Vaping status: Never Used  Substance Use Topics   Alcohol use: Not Currently    Comment: 3-4 drinks a month   Drug use: No     Allergies   Other, Atorvastatin, Hydrocodone , Nitrous oxide, Omeprazole, Sulfa antibiotics, Wellbutrin [bupropion], and Tessalon perles [benzonatate]   Review of Systems Review of Systems :negative unless otherwise stated in HPI.      Physical Exam Triage Vital Signs ED Triage Vitals  Encounter Vitals Group     BP 04/25/24 1033 118/72     Systolic BP Percentile --      Diastolic BP Percentile --      Pulse Rate 04/25/24 1033 77     Resp 04/25/24 1033 18     Temp 04/25/24 1033 97.9 F (36.6 C)     Temp Source 04/25/24 1033 Oral     SpO2 04/25/24 1033 99 %     Weight --      Height --      Head Circumference --      Peak Flow --      Pain Score 04/25/24 1032 4     Pain Loc --      Pain Education --      Exclude from Growth Chart --    No data found.  Updated Vital Signs BP 118/72 (BP Location: Left Arm)   Pulse 77   Temp 97.9 F (36.6 C) (Oral)   Resp 18   LMP  (LMP Unknown)   SpO2  99%   Visual Acuity Right Eye Distance:   Left Eye Distance:   Bilateral Distance:    Right Eye Near:   Left Eye Near:    Bilateral Near:  Physical Exam  GEN: alert, well appearing female, in no acute distress  EYES: no scleral injection or discharge CV: regular rate, brisk cap refill RESP: no increased work of breathing NEURO: alert, moves all extremities appropriately SKIN: warm and dry; multiple erythematous papules on lower extremities and upper extremities, posterior right shoulder with scaly hyperpigmented lesion with surrounding erythema and excoriations    UC Treatments / Results  Labs (all labs ordered are listed, but only abnormal results are displayed) Labs Reviewed - No data to display  EKG   Radiology No results found.  Procedures Procedures (including critical care time)  Medications Ordered in UC Medications - No data to display  Initial Impression / Assessment and Plan / UC Course  I have reviewed the triage vital signs and the nursing notes.  Pertinent labs & imaging results that were available during my care of the patient were reviewed by me and considered in my medical decision making (see chart for details).     Patient is a 53 y.o. femalewho presents for bug bites.  Overall, patient is well-appearing and well-hydrated.  Vital signs  stable.  Talise is afebrile.  Exam concerning for cellulitis on right posterior shoulder from infected mosquito bite.  Treat itching with steroid ointment and antibiotic as below for developing cellulitis.  Patient has history of antibiotic associated yeast infections.  A tablet of Diflucan  was prescribed.  Reviewed expectations regarding course of current medical issues.  All questions asked were answered.  Outlined signs and symptoms indicating need for more acute intervention. Patient verbalized understanding. After Visit Summary given.   Final Clinical Impressions(s) / UC Diagnoses   Final diagnoses:   Cellulitis of right upper extremity     Discharge Instructions      Stop by the pharmacy to pick up your prescriptions.  Follow up with your primary care provider or return to the urgent care, if not improving.   Doxycycline can cause sun sensitivity.     ED Prescriptions     Medication Sig Dispense Auth. Provider   doxycycline (VIBRAMYCIN) 100 MG capsule Take 1 capsule (100 mg total) by mouth 2 (two) times daily. 14 capsule Makynlee Kressin, DO   fluconazole  (DIFLUCAN ) 150 MG tablet Take 1 tablet (150 mg total) by mouth once for 1 dose. 1 tablet Jaquelyn Sakamoto, DO   triamcinolone  ointment (KENALOG ) 0.1 % Apply 1 Application topically 2 (two) times daily. 30 g Charita Lindenberger, DO      PDMP not reviewed this encounter.              Torris House, DO 04/25/24 1048

## 2024-04-25 NOTE — Discharge Instructions (Addendum)
 Stop by the pharmacy to pick up your prescriptions.  Follow up with your primary care provider or return to the urgent care, if not improving.   Doxycycline can cause sun sensitivity.

## 2024-04-25 NOTE — ED Triage Notes (Signed)
 Pt has multiple bug bites all over her body x 1 week. Patient has put neosporin on the bites.

## 2025-01-02 ENCOUNTER — Other Ambulatory Visit: Payer: Self-pay

## 2025-01-02 ENCOUNTER — Ambulatory Visit
Admission: EM | Admit: 2025-01-02 | Discharge: 2025-01-02 | Disposition: A | Discharge status: Other Acute Inpatient Hospital | Source: Home / Self Care | Attending: Family Medicine | Admitting: Family Medicine

## 2025-01-02 ENCOUNTER — Telehealth: Admitting: Nurse Practitioner

## 2025-01-02 ENCOUNTER — Emergency Department
Admission: EM | Admit: 2025-01-02 | Discharge: 2025-01-02 | Disposition: A | Discharge status: Home / Self Care | Source: Home / Self Care | Attending: Emergency Medicine | Admitting: Emergency Medicine

## 2025-01-02 DIAGNOSIS — H5789 Other specified disorders of eye and adnexa: Secondary | ICD-10-CM

## 2025-01-02 DIAGNOSIS — R110 Nausea: Secondary | ICD-10-CM | POA: Diagnosis not present

## 2025-01-02 DIAGNOSIS — Z8659 Personal history of other mental and behavioral disorders: Secondary | ICD-10-CM | POA: Diagnosis not present

## 2025-01-02 DIAGNOSIS — R519 Headache, unspecified: Secondary | ICD-10-CM

## 2025-01-02 DIAGNOSIS — R200 Anesthesia of skin: Secondary | ICD-10-CM

## 2025-01-02 DIAGNOSIS — T7840XA Allergy, unspecified, initial encounter: Secondary | ICD-10-CM | POA: Diagnosis not present

## 2025-01-02 MED ORDER — DEXAMETHASONE SOD PHOSPHATE PF 10 MG/ML IJ SOLN
10.0000 mg | Freq: Once | INTRAMUSCULAR | Status: AC
  Administered 2025-01-02: 10 mg via INTRAMUSCULAR

## 2025-01-02 MED ORDER — PREDNISONE 10 MG (21) PO TBPK: ORAL_TABLET | ORAL | 0 refills | Status: AC

## 2025-01-02 MED ORDER — EPINEPHRINE 0.3 MG/0.3ML IJ SOAJ: 0.3000 mg | INTRAMUSCULAR | 0 refills | Status: AC | PRN

## 2025-01-02 NOTE — ED Notes (Signed)
 Patient is being discharged from the Urgent Care and sent to the Emergency Department via POV . Per Dr.Brimage, patient is in need of higher level of care due to Allergic Rxn. Patient is aware and verbalizes understanding of plan of care.  Vitals:   01/02/25 0934 01/02/25 1025  BP: (!) 149/99 125/88  Pulse: 95 86  Resp: 20 16  SpO2: 99% 98%

## 2025-01-02 NOTE — ED Provider Notes (Signed)
 "  [facility] Provider Note    Event Date/Time   First MD Initiated Contact with Patient 01/02/25 1259     (approximate)   History   Allergic Reaction   HPI  Stacy Mckinney is a 54 y.o. female with a history of hyperlipidemia and bipolar disorder who presents with bilateral eyelid swelling, dizziness, and chest tightness, now improving.  The patient states that she initially woke up this morning with bilateral swelling around her eyes as well as itching and dryness and possible mild swelling around her lips.  The patient states that she used make-up yesterday that she rarely used and subsequently used a make-up remover.  The patient took Benadryl and then had a telehealth visit.  She was prescribed prednisone  (but has not yet filled or taken it) and also took a Pepcid.  However the symptoms persisted so she went to urgent care.  On arrival here, she started to feel lightheaded and was having some chest tightness and shortness of breath.  She states that some of this might have been anxiety as well.  She was given a shot of dexamethasone  and states that she separately started to feel numbness going down the left arm where she got the shot.  All of the symptoms have now resolved.  The eyelid swelling is significantly improved.  She denies feeling dizzy or lightheaded and has no chest pain or tightness, shortness of breath numbness, or other acute symptoms at this time.  She states she is overall feeling much better.  I reviewed the past medical records.  The patient had a virtual visit with the primary care this morning and 8 AM for these symptoms.  She was prescribed a prednisone  taper at that time.  She was then seen at [facility] urgent care later this morning.  She received 10 mg of dexamethasone .   Physical Exam   Triage Vital Signs: ED Triage Vitals  Encounter Vitals Group     BP 01/02/25 1126 (!) 150/95     Girls Systolic BP Percentile --      Girls Diastolic BP  Percentile --      Boys Systolic BP Percentile --      Boys Diastolic BP Percentile --      Pulse Rate 01/02/25 1126 92     Resp 01/02/25 1126 16     Temp 01/02/25 1126 98.4 F (36.9 C)     Temp Source 01/02/25 1126 Oral     SpO2 01/02/25 1126 99 %     Weight 01/02/25 1127 205 lb 0.4 oz (93 kg)     Height --      Head Circumference --      Peak Flow --      Pain Score 01/02/25 1127 0     Pain Loc --      Pain Education --      Exclude from Growth Chart --     Most recent vital signs: Vitals:   01/02/25 1126  BP: (!) 150/95  Pulse: 92  Resp: 16  Temp: 98.4 F (36.9 C)  SpO2: 99%     General: Alert, comfortable appearing, no distress.  CV:  Good peripheral perfusion.  Resp:  Normal effort.  Lungs CTAB. Abd:  No distention.  Other:  Bilateral periorbital mild swelling.  EOMI.  PERRLA.  No photophobia.  No conjunctival injection.  Oropharynx clear.   ED Results / Procedures / Treatments   Labs (all labs ordered are listed, but only abnormal  results are displayed) Labs Reviewed - No data to display   EKG    RADIOLOGY    PROCEDURES:  Critical Care performed: No  Procedures   MEDICATIONS ORDERED IN ED: Medications - No data to display   IMPRESSION / MDM / ASSESSMENT AND PLAN / ED COURSE  I reviewed the triage vital signs and the nursing notes.  54 year old female with PMH as noted above presents with an apparent allergic reaction characterized mainly by bilateral eyelid swelling.  She was sent in from urgent care after presenting there with dizziness, chest tightness, and acute anxiety, subsequently having some numbness in her left arm where she received a dexamethasone  shot.  These symptoms have all now resolved.  On exam the patient is well-appearing.  Vital signs are normal except for borderline hypertension.  She has some eyelid swelling but no other acute findings on exam.  Differential diagnosis includes, but is not limited to, anaphylaxis, other  acute allergic reaction.  There is no evidence of cardiac etiology or other systemic cause.  I reviewed the EKG from urgent care which shows an RBBB but no acute findings.  Patient's presentation is most consistent with acute, uncomplicated illness.  On how the patient presented to urgent care think it was quite reasonable to send her to the ED.  However, at this time, given that the patient's symptoms have significantly improved I think she is appropriate for discharge home.  I did discuss with her the possibility of obtaining some lab workup given the dizziness and chest tightness, however since the symptoms have already resolved I do not feel that there is a strong indication for this.  The patient declines and states she feels comfortable going home.  I counseled the patient on the likely etiologies of her symptoms and the plan of care, and answered all of her questions.  I gave strict return precautions, and she expressed understanding.   FINAL CLINICAL IMPRESSION(S) / ED DIAGNOSES   Final diagnoses:  Allergic reaction, initial encounter  Periorbital swelling     Rx / DC Orders   ED Discharge Orders          Ordered    EPINEPHrine  0.3 mg/0.3 mL IJ SOAJ injection  As needed        01/02/25 1324             Note:  This document was prepared using Dragon voice recognition software and may include unintentional dictation errors.    Jacolyn Pae, MD 01/02/25 1349  "

## 2025-01-02 NOTE — Discharge Instructions (Signed)

## 2025-01-02 NOTE — ED Triage Notes (Signed)
 Started experiencing allergic reaction last night at 8pm (reports eye swelling/ blurry vision when looking at her computer screen). Woke up this am w/ bilateral eye and facial swelling. Nausea and fullness. Two doses of bendryl and 1 tab of pepcid.   Believes reaction is to old makeup or her new pillow. Vit b complex two weeks ago.

## 2025-01-02 NOTE — Progress Notes (Signed)
 " Virtual Visit Consent   Stacy Mckinney, you are scheduled for a virtual visit with a [facility] provider today. Just as with appointments in the office, your consent must be obtained to participate. Your consent will be active for this visit and any virtual visit you may have with one of our providers in the next 365 days. If you have a MyChart account, a copy of this consent can be sent to you electronically.  As this is a virtual visit, video technology does not allow for your provider to perform a traditional examination. This may limit your provider's ability to fully assess your condition. If your provider identifies any concerns that need to be evaluated in person or the need to arrange testing (such as labs, EKG, etc.), we will make arrangements to do so. Although advances in technology are sophisticated, we cannot ensure that it will always work on either your end or our end. If the connection with a video visit is poor, the visit may have to be switched to a telephone visit. With either a video or telephone visit, we are not always able to ensure that we have a secure connection.  By engaging in this virtual visit, you consent to the provision of healthcare and authorize for your insurance to be billed (if applicable) for the services provided during this visit. Depending on your insurance coverage, you may receive a charge related to this service.  I need to obtain your verbal consent now. Are you willing to proceed with your visit today? Stacy Mckinney has provided verbal consent on 01/02/2025 for a virtual visit (video or telephone). Stacy Fireman, NP  Date: 01/02/2025 8:14 AM   Virtual Visit via Video Note   I, Stacy Mckinney, connected with  Stacy Mckinney  ([id number], [birth year]) on 01/02/25 at  8:00 AM EST by a video-enabled telemedicine application and verified that I am speaking with the correct person using two identifiers.  Location: Patient: Virtual Visit Location Patient: Home Provider: Virtual  Visit Location Provider: Home Office   I discussed the limitations of evaluation and management by telemedicine and the availability of in person appointments. The patient expressed understanding and agreed to proceed.    History of Present Illness: Stacy Mckinney is a 54 y.o. who identifies as a female who was assigned female at birth, and is being seen today for possible allergic reaction around her eyes and lips that she noticed this morning when she woke up. Burning, dryness, and some itching. Swelling around her eyes, sinuses and lips. Denies tongue or throat swelling/itching. States the only thing she can think of being exposed to is her make up that she rarely uses. She put some on last night and felt something in her left eye around 8 pm. She immediately removed her make up using make up remover and cetaphil. She threw away all her make up and make up tools away afterward.  She also did get a new pillow, but symptoms started before going to bed. Same pillow brand as before.  Cool compress, x2 benadryl this morning.  No chest pain or SOB.   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 Very pleasant. Resting comfortably at home.  Head is normocephalic, atraumatic.  No labored breathing. Able to speak in full, clear sentences with no  issues  Speech is clear and coherent with logical content.  Patient is alert and oriented at baseline.  Mild swelling around bilateral eyes, maxillary sinuses, and lips.   Assessment and Plan: 1. Allergic reaction, initial encounter (Primary) - predniSONE  (STERAPRED UNI-PAK 21 TAB) 10 MG (21) TBPK tablet; Directions for 6 day taper: Day 1: 2 tablets before breakfast, 1 after both lunch & dinner and 2 at bedtime Day 2: 1 tab before breakfast, 1 after both lunch & dinner and 2 at bedtime Day 3: 1 tab at each meal & 1 at bedtime Day 4: 1 tab at breakfast, 1 at lunch, 1 at bedtime Day 5: 1 tab at breakfast & 1 tab at bedtime Day 6: 1 tab at breakfast  Dispense: 21 each; Refill: 0  Possible allergic reaction to old make up and make up utensils. She has already disposed of those last night. No acute distress during visit.  Rx prednisone  taper pack x6 days- discussed proper med admin and side effects.  Advised she can continue to take OTC benadryl for symptom management and famotidine she has OTC as well.  Push fluids.  Red flag s/s discussed that warrant further evaluation and treatment, if they occur.  States understanding and agrees with plan.   Follow Up Instructions: I discussed the assessment and treatment plan with the patient. The patient was provided an opportunity to ask questions and all were answered. The patient agreed with the plan and demonstrated an understanding of the instructions.  A copy of instructions were sent to the patient via MyChart unless otherwise noted below.   Patient has requested to receive PHI (AVS, Work Notes, etc) pertaining to this video visit through e-mail as they are currently without active MyChart. They have voiced understand that email is not considered secure and their health information could be viewed by someone other than the patient.   The patient was advised to call back or seek an in-person evaluation if the symptoms worsen or if the condition fails to  improve as anticipated.    Stacy Fireman, NP      [1]  Allergies Allergen Reactions   Other Other (See Comments)    Patient cannot take any form of birth control in pill form. Makes the patient pass out.   Atorvastatin     Other reaction(s): Muscle Pain   Hydrocodone  Other (See Comments)   Nitrous Oxide Other (See Comments)    Passes out   Omeprazole Itching    Tingling in hands  Spasms in hands and toes     Tingling in hands  Spasms in hands and toes   Sulfa Antibiotics Diarrhea, Nausea And Vomiting and Nausea Only   Wellbutrin [Bupropion]     Joint swelling   Tessalon Perles [Benzonatate] Anxiety    caused mood swings in spite of being on Lamictal   [2]  Current Outpatient Medications:    predniSONE  (STERAPRED UNI-PAK 21 TAB) 10 MG (21) TBPK tablet, Directions for 6 day taper: Day 1: 2 tablets before breakfast, 1 after both lunch & dinner and 2 at bedtime Day 2:  1 tab before breakfast, 1 after both lunch & dinner and 2 at bedtime Day 3: 1 tab at each meal & 1 at bedtime Day 4: 1 tab at breakfast, 1 at lunch, 1 at bedtime Day 5: 1 tab at breakfast & 1 tab at bedtime Day 6: 1 tab at breakfast, Disp: 21 each, Rfl: 0   diazepam  (VALIUM ) 2 MG tablet, Take by mouth., Disp: , Rfl:    gabapentin (NEURONTIN) 300 MG capsule, Take 1,200 mg by mouth at bedtime. , Disp: , Rfl:    LAMICTAL  200 MG tablet, Take 1 tablet (200 mg total) by mouth daily., Disp: 90 tablet, Rfl: 1   Magnesium 250 MG TABS, Take by mouth., Disp: , Rfl:    triamcinolone  ointment (KENALOG ) 0.1 %, Apply 1 Application topically 2 (two) times daily., Disp: 30 g, Rfl: 0   valACYclovir  (VALTREX ) 1000 MG tablet, , Disp: , Rfl:    valACYclovir  (VALTREX ) 500 MG tablet, , Disp: , Rfl:   "

## 2025-01-02 NOTE — ED Triage Notes (Signed)
 Referred to ED from [facility].  C/O eye swelling this morning.  2 tabs OTC benadryl taken PTA.  1 tab pepcid.  Had a virtual visit and prednisone  prescribed.  Then patient felt 'tightness' to breath. Hx anxiety and panic attacks.  Given a shot at [facility] and after shot felt numbness down left arm (shot given to left deltoid).  Also feeling strange feeling in neck.  States swelling to eyes has improved since this morning.  Voice clear and strong.  Lung CTA. No evidence of oral swelling.  MAE equally and strong. Gait steady. Posture upright and relaxed.

## 2025-01-02 NOTE — ED Provider Notes (Signed)
 " [facility]    CSN: [id number] Arrival date & time: 01/02/25  9071      History   Chief Complaint Chief Complaint  Patient presents with   Allergic Reaction    HPI Stacy Mckinney is a 54 y.o. female.   HPI  Asked by front office staff to see patient as she was short of breath at the desk with facial swelling.   Stacy Mckinney presents for possible allergic reaction.  She took Benadryl 2 tablets (6 am and 7 am) and pepcid prior to arrival. Notes she did not take her Valium  for her anxiety as she choose to drive to the UC.   Facial swelling: yes  Throat swelling: no Abdominal pain: no  Nausea/Vomiting: no Palpitations: no Chest pain/discomfort: no Rash: no Cyanosis: no Headache: yes   New food/drink: no New soaps: no  New lotions: no New detergents:  New medications or supplements:B vitamin complex 2 weeks ago       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   Panic attacks 05/31/2019   History of abnormal cervical Pap smear 04/01/2014   Tobacco use disorder 01/25/2014   Herpes labialis 08/04/2012   Mood disorder 08/04/2012   Hyperlipidemia with target low density lipoprotein (LDL) cholesterol less than 100 mg/dL 88/91/7987   Family history of heart disease in female family member before age 83 10/07/2011    Past Surgical History:  Procedure Laterality Date   ABDOMINAL HYSTERECTOMY  05/23/2016   CYSTOSCOPY  05/23/2017   Procedure: CYSTOSCOPY;  Surgeon: Ward, Mitzie BROCKS, MD;  Location: [facility];  Service: Gynecology;;   ELBOW SURGERY     left elbow   LAPAROSCOPIC  APPENDECTOMY  10/23/2012   Procedure: APPENDECTOMY LAPAROSCOPIC;  Surgeon: Krystal CHRISTELLA Spinner, MD;  Location: [facility];  Service: General;  Laterality: N/A;   LAPAROSCOPIC HYSTERECTOMY N/A 05/23/2017   Procedure: HYSTERECTOMY TOTAL LAPAROSCOPIC;  Surgeon: Ward, Mitzie BROCKS, MD;  Location: [facility];  Service: Gynecology;  Laterality: N/A;   LAPAROSCOPIC OVARIAN CYSTECTOMY     LAPAROSCOPIC UNILATERAL SALPINGECTOMY Left 05/23/2017   Procedure: LAPAROSCOPIC UNILATERAL SALPINGECTOMY;  Surgeon: Ward, Mitzie BROCKS, MD;  Location: [facility];  Service: Gynecology;  Laterality: Left;   LAPAROSCOPIC UNILATERAL SALPINGO OOPHERECTOMY Right 05/23/2017   Procedure: LAPAROSCOPIC UNILATERAL SALPINGO OOPHORECTOMY;  Surgeon: Ward, Mitzie BROCKS, MD;  Location: [facility];  Service: Gynecology;  Laterality: Right;   LEEP     RHINOPLASTY  age 76   due to broken nose   TONSILLECTOMY     age 59    OB History   No obstetric history on file.      Home Medications    Prior to Admission medications  Medication Sig Start Date End Date Taking? Authorizing Provider  famotidine (PEPCID) 20 MG tablet Take 20 mg by mouth 2 (two) times daily. 11/08/24  Yes [provider]  gabapentin (NEURONTIN) 300 MG capsule Take 1,200 mg by mouth at bedtime.    Yes [provider]  LAMICTAL   200 MG tablet Take 1 tablet (200 mg total) by mouth daily. 10/29/19  Yes Eappen, Saramma, MD  WEGOVY 0.25 MG/0.5ML SOAJ SQ injection SMARTSIG:0.25 Milligram(s) SUB-Q Once a Week   Yes [provider]  diazepam  (VALIUM ) 2 MG tablet Take by mouth. 12/21/17   [provider]  Magnesium 250 MG TABS Take by mouth.    [provider]  predniSONE  (STERAPRED UNI-PAK 21 TAB) 10 MG (21) TBPK tablet Directions for 6 day taper: Day 1: 2 tablets before breakfast, 1 after both lunch & dinner and 2 at bedtime Day 2: 1 tab before breakfast, 1 after both lunch & dinner and 2 at bedtime Day 3: 1 tab at each meal & 1 at bedtime Day 4: 1 tab at  breakfast, 1 at lunch, 1 at bedtime Day 5: 1 tab at breakfast & 1 tab at bedtime Day 6: 1 tab at breakfast 01/02/25   Melvenia Ip, NP  triamcinolone  ointment (KENALOG ) 0.1 % Apply 1 Application topically 2 (two) times daily. 04/25/24   Kriste Berth, DO  valACYclovir  (VALTREX ) 1000 MG tablet     [provider]  valACYclovir  (VALTREX ) 500 MG tablet     [provider]    Family History Family History  Problem Relation Age of Onset   Heart disease Father    Hypertension Father    Stroke Maternal Grandmother    Cancer Maternal Grandmother        ovarian   Stroke Paternal Grandmother    Depression Paternal Grandmother    Cancer Paternal Grandmother        breast, ovarian   Breast cancer Paternal Grandmother    Cancer Maternal Aunt        ovarian   Stroke Maternal Grandfather    Stroke Paternal Grandfather     Social History Social History[1]   Allergies   Other, Atorvastatin, Hydrocodone , Nitrous oxide, Omeprazole, Sulfa antibiotics, Wellbutrin [bupropion], and Tessalon perles [benzonatate]   Review of Systems Review of Systems: negative unless otherwise stated in HPI.      Physical Exam Triage Vital Signs ED Triage Vitals [01/02/25 0934]  Encounter Vitals Group     BP (!) 149/99     Girls Systolic BP Percentile      Girls Diastolic BP Percentile      Boys Systolic BP Percentile      Boys Diastolic BP Percentile      Pulse Rate 95     Resp 20     Temp      Temp src      SpO2 99 %     Weight      Height      Head Circumference      Peak Flow      Pain Score      Pain Loc      Pain Education      Exclude from Growth Chart    No data found.  Updated Vital Signs BP (!) 149/99   Pulse 95   Resp 20   LMP  (LMP Unknown)   SpO2 99%   Visual Acuity Right Eye Distance:   Left Eye Distance:   Bilateral Distance:    Right Eye Near:   Left Eye Near:    Bilateral Near:     Physical Exam GEN:     alert, nontoxic appearing femaleand no  distress    HENT:  mucus membranes moist, oropharyngeal without lesions or erythema,  nares patent, no nasal discharge, no uvula  or oral swelling, lips of normal size, no significant facial asymmetry  EYES:   pupils equal and reactive, EOM intact NECK:  supple, normal ROM RESP:  clear to auscultation bilaterally, no increased work of breathing  CVS:   regular rate and rhythm,distal pulses intact    EXT:   atraumatic, no edema  NEURO:  alert, oriented, speech normal, CN 2-12 grossly intact,  normal coordination Skin:   warm and dry, no rash no distal cyanosis       UC Treatments / Results  Labs (all labs ordered are listed, but only abnormal results are displayed) Labs Reviewed - No data to display  EKG   Radiology No results found.  Procedures Procedures (including critical care time)  Medications Ordered in UC Medications  dexamethasone  (DECADRON ) injection 10 mg (10 mg Intramuscular Given 01/02/25 0937)    Initial Impression / Assessment and Plan / UC Course  I have reviewed the triage vital signs and the nursing notes.  Pertinent labs & imaging results that were available during my care of the patient were reviewed by me and considered in my medical decision making (see chart for details).       Patient is a 54 y.o. female  who presents for allergic reaction.  Overall patient is nontoxic-appearing and afebrile.  Vital signs stable. Orlena has has slight headache and eyelid edema. There has been no oral swelling,vomiting, palpitation, or cyanosis suggestive anaphylaxis. Given Decadron  10 mg IM. Took famotidine and benadryl prior to arrival. No EPI indicated at this time.   Patient with history of panic attacks and anxiety. She was intermittently tachycardic and became nauseous. She started to report dizziness and numbness in her right arm after the Decadron  injection.   EKG obtained showing NSR with incomplete RBBB without acute ST or T wave changes; compared to 04/24/20  grossly unchanged. Recommended ED evaluation via EMS and she is agreeable to ED evaluation but requests that her husband drive her to the ED.  She wants to avoid the high cost of the ambulance. We discussed utility of EMS transport but she again declined and understands the risks of private vehicle travel to the ED.  VSS.     Discussed MDM, treatment plan and plan for follow-up with patient.   Final Clinical Impressions(s) / UC Diagnoses   Final diagnoses:  Allergic reaction, initial encounter  Left upper extremity numbness  Nausea without vomiting  History of panic attacks  Headache, unspecified headache type     Discharge Instructions      You have been advised to follow up immediately in the emergency department for concerning signs or symptoms as discussed during your visit. If you declined EMS transport, please have a family member take you directly to the ED at this time. Do not delay.   Based on concerns about condition, if you do not follow up in the ED, you may risk poor outcomes including worsening of condition, delayed treatment and potentially life threatening issues. If you have declined to go to the ED at this time, you should call your PCP immediately to set up a follow up appointment.   Go to ED for red flag symptoms, including; fevers you cannot reduce with Tylenol /Motrin , severe headaches, vision changes, numbness/weakness in part of the body, lethargy, confusion, intractable vomiting, severe dehydration, chest pain, breathing difficulty, severe persistent abdominal or pelvic pain, signs of severe infection (increased redness, swelling of an area), feeling faint or passing out, dizziness, etc. You should especially go to  the ED for sudden acute worsening of condition if you do not elect to go at this time.       ED Prescriptions   None    PDMP not reviewed this encounter.    [1]  Social History Tobacco Use   Smoking status: Former    Current packs/day: 1.50     Average packs/day: 1.5 packs/day for 25.0 years (37.5 ttl pk-yrs)    Types: Cigarettes   Smokeless tobacco: Never  Vaping Use   Vaping status: Never Used  Substance Use Topics   Alcohol use: Not Currently    Comment: 3-4 drinks a month   Drug use: No     Kriste Berth, DO 01/02/25 1027  "

## 2025-01-02 NOTE — Patient Instructions (Addendum)
 " [name], thank you for joining Hadassah Fireman, NP for today's virtual visit.  While this provider is not your primary care provider (PCP), if your PCP is located in our provider database this encounter information will be shared with them immediately following your visit.   A [facility] MyChart account gives you access to today's visit and all your visits, tests, and labs performed at [facility]  click here if you don't have a [facility] MyChart account or go to [email or web address]  Consent: (Patient) Stacy Mckinney provided verbal consent for this virtual visit at the beginning of the encounter.  Current Medications:  Current Outpatient Medications:    predniSONE  (STERAPRED UNI-PAK 21 TAB) 10 MG (21) TBPK tablet, Directions for 6 day taper: Day 1: 2 tablets before breakfast, 1 after both lunch & dinner and 2 at bedtime Day 2: 1 tab before breakfast, 1 after both lunch & dinner and 2 at bedtime Day 3: 1 tab at each meal & 1 at bedtime Day 4: 1 tab at breakfast, 1 at lunch, 1 at bedtime Day 5: 1 tab at breakfast & 1 tab at bedtime Day 6: 1 tab at breakfast, Disp: 21 each, Rfl: 0   diazepam  (VALIUM ) 2 MG tablet, Take by mouth., Disp: , Rfl:    gabapentin (NEURONTIN) 300 MG capsule, Take 1,200 mg by mouth at bedtime. , Disp: , Rfl:    LAMICTAL  200 MG tablet, Take 1 tablet (200 mg total) by mouth daily., Disp: 90 tablet, Rfl: 1   Magnesium 250 MG TABS, Take by mouth., Disp: , Rfl:    triamcinolone  ointment (KENALOG ) 0.1 %, Apply 1 Application topically 2 (two) times daily., Disp: 30 g, Rfl: 0   valACYclovir  (VALTREX ) 1000 MG tablet, , Disp: , Rfl:    valACYclovir  (VALTREX ) 500 MG tablet, , Disp: , Rfl:    Medications ordered in this encounter:  Meds ordered this encounter  Medications   predniSONE  (STERAPRED UNI-PAK 21 TAB) 10 MG (21) TBPK tablet    Sig: Directions for 6 day taper: Day 1: 2 tablets before breakfast, 1 after both lunch & dinner and 2 at bedtime Day 2: 1 tab  before breakfast, 1 after both lunch & dinner and 2 at bedtime Day 3: 1 tab at each meal & 1 at bedtime Day 4: 1 tab at breakfast, 1 at lunch, 1 at bedtime Day 5: 1 tab at breakfast & 1 tab at bedtime Day 6: 1 tab at breakfast    Dispense:  21 each    Refill:  0    Supervising Provider:   BLAISE ALEENE KIDD [phone]     *If you need refills on other medications prior to your next appointment, please contact your pharmacy*  Follow-Up: Call back or seek an in-person evaluation if the symptoms worsen or if the condition fails to improve as anticipated.   Other Instructions Please pick up your prescription for steroids sent at your preferred pharmacy.  Drink plenty of fluids/water to stay hydrated. You can continue to take OTC benadryl for symptom management as well.  If you experience SOB, chest pain, tongue or throat swelling, please report to your nearest ER for evaluation and treatment.    If you have been instructed to have an in-person evaluation today at a local Urgent Care facility, please use the link below. It will take you to a list of all of our available [facility], including address, phone number and hours of operation. Please do not delay  care.  [facility]  If you or a family member do not have a primary care provider, use the link below to schedule a visit and establish care. When you choose a [facility] primary care physician or advanced practice provider, you gain a long-term partner in health. Find a Primary Care Provider  Learn more about [facility]'s in-office and virtual care options:  - Get Care Now  "

## 2025-01-02 NOTE — Discharge Instructions (Addendum)
 You may continue to take Benadryl and/or Pepcid as needed if you have persistent swelling or itching.  The dexamethasone  should last her to body for a couple of days.  If you are still having substantial swelling or any itching or hives tomorrow, start the prednisone  taper.  On the other hand, if you are symptoms have completely resolved, you do not necessarily need to take the prednisone .  We have also prescribed an EpiPen  in case you have a severe allergic reaction in the future.  Follow-up with your primary care provider.  Return to the ER immediately for new, worsening, or persistent severe eyelid or facial swelling, difficulty swallowing, shortness of breath, severe hives, or any other new or worsening symptoms that concern you.
